# Patient Record
Sex: Male | Born: 1950
Health system: Southern US, Community
[De-identification: ages and names within clinical notes are randomized; demographics above are authoritative.]

## PROBLEM LIST (undated history)

## (undated) DIAGNOSIS — K5790 Diverticulosis of intestine, part unspecified, without perforation or abscess without bleeding: Secondary | ICD-10-CM

## (undated) DIAGNOSIS — E785 Hyperlipidemia, unspecified: Secondary | ICD-10-CM

## (undated) DIAGNOSIS — I10 Essential (primary) hypertension: Secondary | ICD-10-CM

## (undated) DIAGNOSIS — R011 Cardiac murmur, unspecified: Secondary | ICD-10-CM

## (undated) DIAGNOSIS — K219 Gastro-esophageal reflux disease without esophagitis: Secondary | ICD-10-CM

## (undated) HISTORY — DX: Gastro-esophageal reflux disease without esophagitis: K21.9

## (undated) HISTORY — PX: COLONOSCOPY: SHX174

## (undated) HISTORY — PX: POLYPECTOMY: SHX149

## (undated) HISTORY — PX: FRACTURE SURGERY: SHX138

## (undated) HISTORY — PX: FINGER AMPUTATION: SHX636

## (undated) HISTORY — DX: Hyperlipidemia, unspecified: E78.5

## (undated) HISTORY — DX: Diverticulosis of intestine, part unspecified, without perforation or abscess without bleeding: K57.90

## (undated) HISTORY — DX: Essential (primary) hypertension: I10

## (undated) HISTORY — DX: Cardiac murmur, unspecified: R01.1

---

## 2002-04-01 ENCOUNTER — Encounter: Payer: Self-pay | Admitting: Emergency Medicine

## 2002-04-01 ENCOUNTER — Emergency Department (HOSPITAL_COMMUNITY): Admission: EM | Admit: 2002-04-01 | Discharge: 2002-04-01 | Payer: Self-pay | Admitting: Emergency Medicine

## 2005-04-16 ENCOUNTER — Inpatient Hospital Stay (HOSPITAL_COMMUNITY): Admission: EM | Admit: 2005-04-16 | Discharge: 2005-04-22 | Payer: Self-pay | Admitting: Emergency Medicine

## 2005-04-24 ENCOUNTER — Ambulatory Visit: Payer: Self-pay | Admitting: Family Medicine

## 2005-07-30 ENCOUNTER — Ambulatory Visit: Payer: Self-pay | Admitting: Family Medicine

## 2005-12-29 ENCOUNTER — Ambulatory Visit: Payer: Self-pay | Admitting: Family Medicine

## 2006-01-12 ENCOUNTER — Ambulatory Visit: Payer: Self-pay | Admitting: Family Medicine

## 2006-10-16 ENCOUNTER — Ambulatory Visit: Payer: Self-pay | Admitting: Family Medicine

## 2006-10-19 ENCOUNTER — Ambulatory Visit: Payer: Self-pay | Admitting: Family Medicine

## 2006-10-22 ENCOUNTER — Ambulatory Visit: Payer: Self-pay | Admitting: Family Medicine

## 2006-10-29 ENCOUNTER — Ambulatory Visit: Payer: Self-pay | Admitting: Family Medicine

## 2006-11-16 ENCOUNTER — Ambulatory Visit: Payer: Self-pay | Admitting: Cardiology

## 2006-11-23 ENCOUNTER — Ambulatory Visit: Payer: Self-pay

## 2006-11-30 ENCOUNTER — Ambulatory Visit: Payer: Self-pay | Admitting: Family Medicine

## 2007-02-26 ENCOUNTER — Ambulatory Visit: Payer: Self-pay | Admitting: Family Medicine

## 2007-09-14 ENCOUNTER — Ambulatory Visit: Payer: Self-pay | Admitting: Family Medicine

## 2007-12-23 ENCOUNTER — Ambulatory Visit: Payer: Self-pay | Admitting: Family Medicine

## 2008-05-24 ENCOUNTER — Ambulatory Visit: Payer: Self-pay | Admitting: Family Medicine

## 2009-01-16 ENCOUNTER — Ambulatory Visit: Payer: Self-pay | Admitting: Family Medicine

## 2009-05-24 ENCOUNTER — Ambulatory Visit: Payer: Self-pay | Admitting: Family Medicine

## 2010-06-05 ENCOUNTER — Ambulatory Visit (INDEPENDENT_AMBULATORY_CARE_PROVIDER_SITE_OTHER): Payer: BLUE CROSS/BLUE SHIELD | Admitting: Family Medicine

## 2010-06-05 DIAGNOSIS — E119 Type 2 diabetes mellitus without complications: Secondary | ICD-10-CM

## 2010-06-05 DIAGNOSIS — I10 Essential (primary) hypertension: Secondary | ICD-10-CM

## 2010-06-05 DIAGNOSIS — Z79899 Other long term (current) drug therapy: Secondary | ICD-10-CM

## 2010-06-05 DIAGNOSIS — E782 Mixed hyperlipidemia: Secondary | ICD-10-CM

## 2010-07-09 NOTE — Assessment & Plan Note (Signed)
[name]                            CARDIOLOGY OFFICE NOTE   [name], [name]                   MRN:          [id number]  DATE:11/16/2006                            DOB:          May 12, 1950    PRIMARY:  Dr. Sharlot Gowda.   REASON FOR CONSULTATION:  Evaluate patient with heart murmur and  abnormal EKG.   HISTORY OF PRESENT ILLNESS:  Patient is a 60 year old African-American  gentleman who presents for evaluation of heart murmur.  He has been in  good health except for hospitalization last year.  Apparently he  had  flu-like symptoms.  He presented to the hospital where he was noted to  have severe hyperglycemia.  His wife reports that he was hospitalized  for a week.  This was a new diagnosis with diabetes.  He has also  recently been diagnosed with hyperlipidemia.  He saw Dr. Susann Givens and was  noted to have some increased voltages on his EKG as well as murmur.  The  patient has not been told of a heart murmur in the past.  He has  otherwise been well.  He denies any cardiovascular symptoms.  He works  as a Arboriculturist.  He climbs stairs multiple times a day.  He denies any  shortness of breath, PND or orthopnea.  He denies any chest discomfort,  neck or arm discomfort.  He has had no palpitations, presyncope or  syncope.  He had no unusual childhood illnesses.   PAST MEDICAL HISTORY:  1. Diabetes mellitus x1 year.  2. Hyperlipidemia.   PAST SURGICAL HISTORY:  1. Left pinky resected after an infection.  2. Left knee surgery after a fracture.   ALLERGIES:  None.   MEDICATIONS:  1. Acto/Met 15/850 b.i.d.  2. Enalapril 5 mg daily.  3. Lipitor 20 mg daily.  4. Metformin 1000 mg b.i.d.  5. Simvastatin 40 mg daily.  6. Glipizide 5 mg b.i.d.  7. Insulin p.r.n.   SOCIAL HISTORY:  1. The patient is married.  2. He has 1 child and 1 grandchild.  3. He quit smoking at age 62.  He smoked briefly before that.  4. He does not drink  alcohol.   FAMILY HISTORY:  1. Noncontributory for early coronary artery disease.  2. Both of his parents died at a young age of cancer.   REVIEW OF SYSTEMS:  As stated in the HPI and positive for reflux.  Negative for other systems.   PHYSICAL EXAMINATION:  The patient is in no distress.  Blood pressure  116/74, heart rate 76 and regular, weight 186 pounds.  HEENT:  Eyes unremarkable, pupils equal, round, react to light, fundi  not visualized, oral mucosa unremarkable.  NECK:  No jugular venous distention at 45 degrees, carotid upstroke  brisk and symmetric, transmitted systolic murmurs, no bruits, no  thyromegaly.  LYMPHATICS:  No cervical, axillary or inguinal adenopathy.  LUNGS:  Clear to auscultation bilaterally.  BACK:  No costovertebral angle tenderness.  CHEST:  Unremarkable.  HEART:  Not displaced and discrete, S1 within normal limits, S2 not  split, probable soft S2, no S3,  no S4, a 3/6 apical systolic murmur  radiating up the aortic outflow tract and heard at the right upper  sternal border and into the neck, no diastolic murmurs.  ABDOMEN:  Flat, positive bowel sounds, normal in frequency and pitch, no  bruits, no rebound, no guarding, no midline pulses, no masses, no  hepatomegaly, no splenomegaly.  SKIN:  No rashes, no nodules.  EXTREMITIES:  Pulses 2+ throughout, no edema, no cyanosis, no clubbing.  NEURO:  Oriented to person, place and time, cranial nerves II through  XII grossly intact, motor grossly intact.   EKG:  Sinus rhythm, rate 76, early transition lead V2, no acute ST-T  wave changes, intervals within normal limits.   ASSESSMENT AND PLAN:  1. Heart murmur.  The patient has a heart murmur consistent with      aortic stenosis.  I do not hear an S4 and he does not have      particularly elevated voltages.  I doubt that this is      hemodynamically significant.  He is not having any symptoms.  I      will check an echocardiogram.  Further evaluation will  be based on      these results.  2. Risk reduction.  The patient will continue to have risk reduction      per Dr. Susann Givens.  I will consider a screening exercise treadmill      test, given his diabetes, after I see his echocardiogram.  3. Followup.  I will see him back in about 1 month to review.     Rollene Rotunda, MD, [facility]  Electronically Signed   JH/MedQ  DD: 11/16/2006  DT: 11/16/2006  Job #: [id number]   cc:   Sharlot Gowda, M.D.

## 2010-07-12 NOTE — Discharge Summary (Signed)
NAMESEVERO, [name]            ACCOUNT NO.:  [id number]   MEDICAL RECORD NO.:  [id number]          PATIENT TYPE:  INP   LOCATION:  1617                         FACILITY:  [facility]   PHYSICIAN:  Elliot Cousin, M.D.    DATE OF BIRTH:  1950/07/31   DATE OF ADMISSION:  04/16/2005  DATE OF DISCHARGE:  04/22/2005                                 DISCHARGE SUMMARY   ADDENDUM:  THIS IS AN ADDENDUM DISCHARGE SUMMARY.  PLEASE SEE THE PREVIOUS  DISCHARGE SUMMARY DICTATED BY DR. Arletha Grippe ON April 21, 2005.   FINAL DISCHARGE DIAGNOSES:  1.  Newly diagnosed diabetes mellitus with presentation of diabetic      ketoacidosis.  2.  Hypertension.  3.  Hematuria secondary to the Foley catheter and/or urinary tract      infection.  4.  Pneumonia.  5.  Normocytic anemia.   DISCHARGE MEDICATIONS:  1.  Metformin 500 mg 1 tablet b.i.d.  2.  Glipizide 5 mg b.i.d.  3.  Enalapril 5 mg daily.  4.  Levaquin 500 mg daily for an additional seven days.  5.  Multivitamin with iron once daily.   DISPOSITION:  The patient was discharged to home in improved and stable  condition.  He has an appointment with physician, Dr. Audria Ramirez at the  [facility] clinic on Thursday, April 24, 2005.   HOSPITAL COURSE:  Problem 1:  NEWLY DIAGNOSED DIABETES MELLITUS WITH  PRESENTATION OF DKA:  Please see the previous discharge summary dictated by  Dr. Arletha Grippe.  Following Dr. Rickard Rhymes assessment, the patient remained stable  with regards to diabetes management.  He received diabetes education from  the registered dietician, the registered nurse, and the dictating physician.  The patient demonstrated fairly good understanding of diabetes and the  importance of compliance with medication and diet recommendations.  Prior to  this hospitalization, the patient really did not have a primary care  physician nor did he appreciate regular medical checkups.  For this reason,  the patient was discharged to home on an oral regimen only  rather than an  oral regimen and insulin.  Once the patient becomes acquainted with his new  primary care physician, Dr. Audria Ramirez, perhaps insulin therapy can be  restarted, if needed.  Both the Glucophage and glipizide are new medications  for the patient; therefore, with time, these medications may be all he  needs, at least in the short term.  Certainly, the patient's primary care  physician can determine whether or not the patient will need home insulin  therapy.  Both the patient and the patient's wife were informed of the  [facility] services and the appointment that was made.  The patient was  also given prescriptions for a Glucometer, lancets, and Glucometer strips.  He was advised to fill his prescriptions at [facility], which  provides a significantly reduced price per prescription.  The patient was  strongly advised to check his capillary blood sugars twice daily.  He was  also advised not to skip meals on the current medication therapy.  The  patient's hemoglobin A1C was 16.1 during the hospital course.  Problem 2:  HYPERTENSION:  The patient's blood pressures became a bit more  controlled towards the end of the hospital course.  He was maintained on  enalapril 5 mg daily.   Problem 3:  MILD HEMATURIA/PNEUMONIA/URINARY TRACT INFECTION:  Several days  prior to hospital discharge, the patient developed a low-grade fever.  He  complained of painful urination; however, at the time, he had an indwelling  Foley catheter.  When the Foley catheter was removed, he noticed some blood  at the urethral site.  Urinalysis was ordered and revealed large blood,  negative nitrites, and negative leukocytes.  The patient was started  empirically on ciprofloxacin.  For further workup of the fever, a chest x-  ray was ordered.  The chest x-ray revealed probable posterior lower lung  pneumonia.  The antibiotic was therefore changed to Levaquin 500 mg daily to  cover both the urinary  tract infection and pneumonia.  The hematuria may  have been also secondary to Foley trauma; however, he covered with Levaquin  for the urinary tract infection potential.  The patient had no complaints of  a productive cough during the hospital course; however, he did have a day or  two of intermittent nonproductive cough.  The patient was advised to take  over-the-counter Robitussin as needed for the cough.  Following the  initiation of antibiotic therapy, the patient's fever resolved.  At the time  of hospital discharge, his white blood cell count was within normal limits  at 7.2.   Problem 4:  NORMOCYTIC ANEMIA:  The patient's hemoglobin on admission was  13.7; however, following volume repletion, his hemoglobin slowly fell to  10.7 prior to hospital discharge.  There was no evidence of GI bleeding  during the hospital course, although his stools were not assessed for  microscopic blood.  The decrease in hemoglobin,  in part,  was secondary to  hemodilution from the IV fluids.  Iron studies were ordered and revealed a  vitamin B12 of 1188, ferritin of 427, total iron of 63, and a TIBC of 177.  These indices are more consistent with anemia of chronic disease; however,  given his age, he would benefit from further evaluation with a colonoscopy.  This decision will be deferred to the patient's new primary care physician,  Dr. Audria Ramirez.  Both the patient and the patient's wife were encouraged to  follow up on the recommendation of colon cancer screening.  The patient was  discharged to home on a multivitamin with iron once daily.      Elliot Cousin, M.D.  Electronically Signed     DF/MEDQ  D:  04/25/2005  T:  04/25/2005  Job:  [id number]   cc:   Maurice March, M.D.  Fax: [phone]

## 2010-07-12 NOTE — H&P (Signed)
NAMEKIING, James            ACCOUNT NO.:  [id number]   MEDICAL RECORD NO.:  [id number]          PATIENT TYPE:  INP   LOCATION:  0102                         FACILITY:  [facility]   PHYSICIAN:  Lonia Blood, M.D.DATE OF BIRTH:  09-19-1950   DATE OF ADMISSION:  04/16/2005  DATE OF DISCHARGE:                                HISTORY & PHYSICAL   PRIMARY CARE PHYSICIAN:  Sharlot Gowda, M.D.   CHIEF COMPLAINT:  Vomiting and weakness.   HISTORY OF PRESENT ILLNESS:  [name] is a 60 year old gentleman  with no apparent previous medical history to speak of.  He was in his usual  state of health, but noticed increased urination dating back to probably 2  weeks ago.  The patient, himself, is significant obtunded at present and  unable to provide any history.  All history is provided by his wife, who  accompanies him to the ER.  The patient's polyuria continued for at least 2  weeks.  This was associated with intermittent lethargy.  Approximately 4  days ago, the patient began to develop significant nausea.  The following  day, this developed into diarrhea.  The patient vomited multiple times  during the day.  He was subsequently sent home from work.  This was a Monday  (it is currently Wednesday).  Since being sent home from work, the patient  has continued to vomit.  He has had minimal p.o. intake.  He has, however,  continued to urinate on a very frequent basis.  He has become more and more  lethargic.  The patient was, in fact, secondary to see Dr. Susann Givens today at  noon, but was too weak to even get up and go.  As the patient's weakness  continued through the afternoon, he also began to be very confused.  With  this new development, the patient's wife became concerned about him, and  transported him to the emergency room.  In the emergency room, a serum  glucose of 1226 with a serum bicarbonate of 6 has been appreciated, and I  have been called to admit the patient for  DKA.   REVIEW OF SYSTEMS:  The patient is unable to provide any history, as he is  significantly obtunded at the present time.   PAST MEDICAL HISTORY:  No prior medical history, admissions or surgeries,  per the patient's wife.   MEDICATIONS:  No chronic medications.   ALLERGIES:  No known drug allergies.   FAMILY HISTORY:  Unable to obtain secondary to obtundation.   SOCIAL HISTORY:  The patient lives in [place].  He is married.  He does  not smoke.  He does not drink.  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   DATA REVIEWED:  Sodium is low at 123, potassium is high at 5.8, chloride of  76, serum bicarbonate of 6, BUN 79, creatinine 4.1, serum glucose of 1226.  White count is elevated at 18.8.  Hemoglobin is normal at 15.8 with an MCV  of 85 and platelet count of 303.  No other labs or x-rays or EKGs are  available at this time.   PHYSICAL EXAMINATION:  VITAL SIGNS:  Temperature 97.7, blood pressure  101/69, heart rate 120, respiratory rate 20, O2 saturation of 99% on room  air.  GENERAL:  Well-developed, well-nourished male in no acute respiratory  distress, although he is tachypneic.  He does not appear to be in severe  respiratory distress, but he is tachypneic.  HEENT:  Normocephalic and atraumatic.  Pupils equal, round and reactive to  light and accommodation, though sluggish.  OC/OP remarkable for dry mucous  membranes.  NECK:  No JVD, no lymphadenopathy, no thyromegaly, with bounding carotid  pulse.  LUNGS:  Clear to auscultation bilaterally without wheezes or rhonchi.  CARDIOVASCULAR:  Tachycardic at 120 beats per minute without murmur, gallop,  or rub appreciable.  ABDOMEN:  Thin, nontender, nondistended.  Soft.  Bowel sounds present.  No  hepatosplenomegaly.  No rebound.  No ascites.  EXTREMITIES:  No significant clubbing, cyanosis, or edema bilateral lower  extremities.  NEUROLOGIC:  The patient is able to move all 4 extremities on  command.  Cranial nerves II-XII were intact.  He is not alert, but does intermittently  open eyes and attempt to answer questions, though speech is simply garbled.  There is no Babinski.  He is intact to sensation and touch throughout.   IMPRESSION AND PLAN:  1.  Severe diabetic hyperosmolar state versus diabetic ketoacidosis.  The      patient does not carry a previous diagnosis of diabetes.  This will be a      new diagnosis for him.  Certainly, now, he is suffering with a severe      ketotic or non-ketotic state.  His serum ketones are still pending.      Regardless, the patient will require an insulin drip and very high      volume of IV fluid administration.  We will admit him to the ICU.  Will      place him on a Glucommander, but we will follow his blood sugar very      closely.  I do not wish to correct him at a rate any greater than 200 to      250 mg/dl per hour.  We will need to follow him closely on the      Glucommander to assure this does not happen.  We, of course, will follow      his metabolic acidosis very closely and hope that his gap will close      rapidly with IV infusion of insulin.  2.  Severe metabolic acidosis.  The patient's acidosis is felt to be      secondary to ketoacidosis in this patient who appears to be suffering      with severe DKA.  Treatment will be to address the primary issue.      Bicarbonate was not indicated at this time, as the patient's acidosis      should correct with the appropriate administration of high volume fluid      and IV insulin.  We will follow his anion gap closely, and, if his      bicarbonate does not increase with IV insulin administration, will      consider adjusting our fluid regimen.  3.  Severe dehydration.  This is, of course, secondary to diabetic      ketoacidosis and to high volume losses due to glucosuria over the last 2     weeks.  We will hydrate the patient aggressively, as he  is a 60 year old      gentleman who  is otherwise healthy.  I will place two IVs - one we will      use to administer normal saline continuously at 200 cc per hour.  The      patient has received a 1 liter bolus already in the emergency room.  The      second IV will be use to administer 100 cc of normal saline initially,      and the for appropriate titration as required for the Glucommander.  The      first IV will not be changed, despite recommendations from the      Glucommander, and this is where the second IV comes in, as it will allow      [place] to administer varying doses of glucose without affecting the      patient's backup normal saline rate.  4.  Acute renal failure.  It is my hope that this represents a pure prerenal      azotemia.  The patient is certainly severely dehydrated.  We will      withhold the renal ultrasound at this time.  The patient is not showing      evidence of obstruction, as he is urinating very frequently.  We with      place a Foley catheter, and follow I&O's closely.  If his renal function      does not improve with volume resuscitation, we will then consider a      renal ultrasound and possible renal consultation.  5.  Hyperkalemia.  This is felt to be simply secondary to intracellular      shifts.  As is usual with diabetic ketoacidosis, we expect that his      potassium will actually fall precipitously once we begin to correct his      diabetic ketoacidosis.  We will check BMETs on a q.1h. basis.  When      potassium falls below 4, we will consider replacement of his potassium.  6.  Pseudohyponatremia.  The patient presents with a sodium of 123.  Within      the setting of a serum glucose of 1226, this is very clearly a      pseudohyponatremia.  We will follow the patient's sodium on a 1-hour      basis as we administer insulin and correct his serum glucose.  7.  Altered mental status.  This is felt to be a sequelae of diabetic      ketoacidosis, severe dehydration and acute renal       failure, as well as severe metabolic acidosis.  We will attend to all of      these issues as detailed above, and expect the patient's mental status      will return to normal.  Neurologic exam does not suggest any localizing      lesions, and, therefore, a CT scan of the head is not necessary at this      time.      Lonia Blood, M.D.  Electronically Signed     JTM/MEDQ  D:  04/16/2005  T:  04/17/2005  Job:  [id number]   cc:   Sharlot Gowda, M.D.  Fax: [phone]

## 2010-07-12 NOTE — Discharge Summary (Signed)
NAMEJOSHA, [name] NO.:  [id number]   MEDICAL RECORD NO.:  [id number]          PATIENT TYPE:  INP   LOCATION:  1617                         FACILITY:  [facility]   PHYSICIAN:  Nelma Rothman, MD   DATE OF BIRTH:  [birth year]   DATE OF ADMISSION:  04/16/2005  DATE OF DISCHARGE:                                 DISCHARGE SUMMARY   INTERIM DISCHARGE SUMMARY:  Please note that this is an Interim Discharge  Summary, and final Discharge Summary will be dictated by physician at that  time.   CURRENT DIAGNOSES:  1.  Diabetic ketoacidosis.  2.  New diagnosis of diabetes.  3.  Hypertension.   PROCEDURES:  1.  Chest x-ray on admission demonstrated no active disease.  2.  CT of the abdomen and pelvis with contrast on February 23 demonstrated      no acute abnormality.  On CT of the abdomen, there was a probable      incidental 23 mm right adrenal adenoma as well as small pleural      effusions with mild bibasilar atelectasis.  There were rectosigmoid      colonic diverticula but no diverticulitis or abscess.  There was a very      small amount of fluid that layered posteriorly in the pelvis.   LABORATORY DATA:  On admission, ABG 7.148, pCO2 of 22.  White blood cell  count on admission was 18.8, currently 7.4.  Creatinine initially was 3.3  and is now 0.8.  Hemoglobin A1c is 16.1.  Cardiac enzymes were negative x3.  Liver function tests were within normal limits, and a lipase was 18.  Urine  drug screen was negative.  Urinalysis on admission was positive only for  ketones as well as a small amount of bilirubin and glucose.  Urine culture  was negative.   HISTORY AND PHYSICAL:  Please seen dictated History and Physical by Dr.  Jetty Duhamel on February 21.  But briefly, [name] presented to the  [facility] with vomiting, weakness, and confusion and  was found to be in diabetic ketoacidosis.  He had no known history of  diabetes.   HOSPITAL  COURSE:  Detailed by systems.   #1.  DIABETIC KETOACIDOSIS:  [name] was admitted to the ICU and placed  on IV insulin drip.  With this, his sugars decreased gradually as would be  expected.  He was vigorously resuscitated with IV fluids as well.  With  improvement in his blood sugar, his mental status improved as well.  He  continued to have some abdominal pain as well as vomiting even with  resolution of his ketoacidosis.  Given this and a persistently elevated  white blood cell count, a CT abdomen and pelvis was performed on February 23  to evaluate for any intra-abdominal pathology which was essentially negative  for any acute process.  With regard to the etiology of diabetic  ketoacidosis, I suspect that this is secondary to undiagnosed, untreated  diabetes.  No other etiology was ever elicited.  Cardiac enzymes were  negative x3.  A urine culture was  negative.  Chest x-ray was clear.   #2.  NEW DIAGNOSIS OF DIABETES:  I had hoped that we would be able to  control his blood sugars with oral hypoglycemics given that he is a newly  diagnosed diabetic; however, with the hemoglobin A1c of 16.1 and his  presentation in diabetic ketoacidosis, I suspect this will not be the case.  He is currently on metformin and glipizide in attempt to increase his levels  of such, but he is still requiring 18 units of Lantus at this time with  sugars with suboptimal control.  Over the next couple of days, we will hope  to stabilize his blood sugars on a regimen that is easiest for him at home.  Should he need to go home on insulin, which I suspect he will, could  probably discontinue the glipizide as it is not adding much at that point.   #3.  HYPERTENSION:  Have started low-dose Enalapril given his history of  diabetes.  Will need to have his electrolytes and creatinine monitored  closely as an outpatient, and this will likely need to be increased as  needed.   DISPOSITION:  At this  point, his  sugars are still suboptimal, and he needs  a fair amount of education and teaching prior to discharge as well as  teaching of insulin administration technique or I fear he will be right back  in the hospital again.  I expect he will be nearing time for discharge in  the next couple of days and will need very close followup with his doctor  over the next few weeks to months.   Please note that this is an Interim Discharge Summary, and a final Discharge  Summary will be dictated with medications at the time of discharge by the  physician then.      Nelma Rothman, MD  Electronically Signed     RAR/MEDQ  D:  04/20/2005  T:  04/20/2005  Job:  [id number]

## 2010-09-26 ENCOUNTER — Encounter: Payer: Self-pay | Admitting: Family Medicine

## 2010-10-08 ENCOUNTER — Ambulatory Visit: Payer: BLUE CROSS/BLUE SHIELD | Admitting: Family Medicine

## 2010-10-15 ENCOUNTER — Ambulatory Visit (INDEPENDENT_AMBULATORY_CARE_PROVIDER_SITE_OTHER): Payer: BLUE CROSS/BLUE SHIELD | Admitting: Family Medicine

## 2010-10-15 ENCOUNTER — Encounter: Payer: Self-pay | Admitting: Family Medicine

## 2010-10-15 DIAGNOSIS — E1169 Type 2 diabetes mellitus with other specified complication: Secondary | ICD-10-CM | POA: Insufficient documentation

## 2010-10-15 DIAGNOSIS — E785 Hyperlipidemia, unspecified: Secondary | ICD-10-CM

## 2010-10-15 DIAGNOSIS — E1159 Type 2 diabetes mellitus with other circulatory complications: Secondary | ICD-10-CM | POA: Insufficient documentation

## 2010-10-15 DIAGNOSIS — Z23 Encounter for immunization: Secondary | ICD-10-CM

## 2010-10-15 DIAGNOSIS — I10 Essential (primary) hypertension: Secondary | ICD-10-CM

## 2010-10-15 DIAGNOSIS — E119 Type 2 diabetes mellitus without complications: Secondary | ICD-10-CM | POA: Insufficient documentation

## 2010-10-15 NOTE — Patient Instructions (Signed)
Come back in about a month for flu shot otherwise he'll see you in 4 months

## 2010-10-15 NOTE — Progress Notes (Signed)
  Subjective:    Patient ID: [name], male    DOB: 12-24-1950, 60 y.o.   MRN: [id number]  HPI He is here for a diabetes recheck. He has made some dietary changes and has lost 7 pounds. Continues on medications listed in the chart. He checks his blood sugars and they usually run in the low 100s. His last eye exam was May 21. He checks his feet regularly. His work and home life are going well. He has no other concerns or complaints. Social history was reviewed and is in the record.   Review of Systems Negative except as above    Objective:   Physical Exam Alert and in no distress otherwise not examined. Hemoglobin A1c 6.9      Assessment & Plan:  Diabetes. Hypertension. Hyperlipidemia. He is to continue with his present medication regimen as well as lifestyle. Recommend returning here month for flu shot. I will also give pneumonia shot

## 2010-12-11 ENCOUNTER — Other Ambulatory Visit: Payer: Self-pay | Admitting: Family Medicine

## 2010-12-12 ENCOUNTER — Telehealth: Payer: Self-pay | Admitting: Family Medicine

## 2010-12-12 NOTE — Telephone Encounter (Signed)
DONE

## 2010-12-12 NOTE — Telephone Encounter (Signed)
Patient needs to make an appointment with doctor.

## 2011-02-20 ENCOUNTER — Ambulatory Visit (INDEPENDENT_AMBULATORY_CARE_PROVIDER_SITE_OTHER): Payer: BLUE CROSS/BLUE SHIELD | Admitting: Family Medicine

## 2011-02-20 ENCOUNTER — Encounter: Payer: Self-pay | Admitting: Family Medicine

## 2011-02-20 VITALS — BP 120/80 | HR 70 | Wt 170.0 lb

## 2011-02-20 DIAGNOSIS — E119 Type 2 diabetes mellitus without complications: Secondary | ICD-10-CM

## 2011-02-20 DIAGNOSIS — E1169 Type 2 diabetes mellitus with other specified complication: Secondary | ICD-10-CM

## 2011-02-20 DIAGNOSIS — E785 Hyperlipidemia, unspecified: Secondary | ICD-10-CM

## 2011-02-20 DIAGNOSIS — Z23 Encounter for immunization: Secondary | ICD-10-CM

## 2011-02-20 DIAGNOSIS — E1159 Type 2 diabetes mellitus with other circulatory complications: Secondary | ICD-10-CM

## 2011-02-20 DIAGNOSIS — I10 Essential (primary) hypertension: Secondary | ICD-10-CM

## 2011-02-20 LAB — POCT GLYCOSYLATED HEMOGLOBIN (HGB A1C): Hemoglobin A1C: 6.7

## 2011-02-20 NOTE — Progress Notes (Signed)
  Subjective:    Patient ID: [name], male    DOB: Apr 01, 1950, 60 y.o.   MRN: [id number]  HPI He is here for followup on his diabetes. He does check his blood sugars fairly regularly and they usually run in the low 100s. He does check his feet periodically and has had an eye exam in the last year. He does not smoke or drink. He does exercise fairly regularly and recently bought a bicycle.  Review of Systems     Objective:   Physical Exam Alert and in no distress.  A1c is 6.7      Assessment & Plan:    I encouraged him to remain active. Opinion on present medication regimen. Recheck here 4 months.

## 2011-03-23 ENCOUNTER — Other Ambulatory Visit: Payer: Self-pay | Admitting: Family Medicine

## 2011-03-24 ENCOUNTER — Telehealth: Payer: Self-pay | Admitting: Family Medicine

## 2011-03-31 NOTE — Telephone Encounter (Signed)
03/31/2011 °

## 2011-04-01 ENCOUNTER — Telehealth: Payer: Self-pay | Admitting: Internal Medicine

## 2011-04-01 NOTE — Telephone Encounter (Signed)
Find out exactly what ointment he wants.

## 2011-04-01 NOTE — Telephone Encounter (Signed)
Called [facility] and ordered for pt

## 2011-04-23 ENCOUNTER — Other Ambulatory Visit: Payer: Self-pay | Admitting: Family Medicine

## 2011-06-18 ENCOUNTER — Other Ambulatory Visit: Payer: Self-pay | Admitting: Family Medicine

## 2011-06-19 ENCOUNTER — Encounter: Payer: Self-pay | Admitting: Family Medicine

## 2011-06-19 ENCOUNTER — Ambulatory Visit (INDEPENDENT_AMBULATORY_CARE_PROVIDER_SITE_OTHER): Payer: BLUE CROSS/BLUE SHIELD | Admitting: Family Medicine

## 2011-06-19 DIAGNOSIS — I10 Essential (primary) hypertension: Secondary | ICD-10-CM

## 2011-06-19 DIAGNOSIS — E1159 Type 2 diabetes mellitus with other circulatory complications: Secondary | ICD-10-CM

## 2011-06-19 DIAGNOSIS — E1169 Type 2 diabetes mellitus with other specified complication: Secondary | ICD-10-CM

## 2011-06-19 DIAGNOSIS — Z79899 Other long term (current) drug therapy: Secondary | ICD-10-CM

## 2011-06-19 DIAGNOSIS — E119 Type 2 diabetes mellitus without complications: Secondary | ICD-10-CM

## 2011-06-19 DIAGNOSIS — E785 Hyperlipidemia, unspecified: Secondary | ICD-10-CM

## 2011-06-19 LAB — CBC WITH DIFFERENTIAL/PLATELET
Basophils Absolute: 0 10*3/uL (ref 0.0–0.1)
Eosinophils Relative: 5 % (ref 0–5)
HCT: 43 % (ref 39.0–52.0)
Hemoglobin: 14.2 g/dL (ref 13.0–17.0)
Lymphocytes Relative: 24 % (ref 12–46)
MCHC: 33 g/dL (ref 30.0–36.0)
MCV: 85.1 fL (ref 78.0–100.0)
Monocytes Absolute: 0.9 10*3/uL (ref 0.1–1.0)
Monocytes Relative: 8 % (ref 3–12)
Neutro Abs: 6.8 10*3/uL (ref 1.7–7.7)
RDW: 15.5 % (ref 11.5–15.5)

## 2011-06-19 LAB — COMPREHENSIVE METABOLIC PANEL
AST: 21 U/L (ref 0–37)
Albumin: 4.4 g/dL (ref 3.5–5.2)
BUN: 28 mg/dL — ABNORMAL HIGH (ref 6–23)
Calcium: 9.3 mg/dL (ref 8.4–10.5)
Chloride: 106 mEq/L (ref 96–112)
Creat: 1.04 mg/dL (ref 0.50–1.35)
Glucose, Bld: 149 mg/dL — ABNORMAL HIGH (ref 70–99)
Potassium: 4.1 mEq/L (ref 3.5–5.3)

## 2011-06-19 LAB — LIPID PANEL
Cholesterol: 159 mg/dL (ref 0–200)
HDL: 45 mg/dL (ref >39)
Total CHOL/HDL Ratio: 3.5 Ratio
Triglycerides: 76 mg/dL (ref <150)

## 2011-06-19 MED ORDER — ENALAPRIL MALEATE 5 MG PO TABS: 5.0000 mg | ORAL_TABLET | Freq: Every day | ORAL | Status: DC

## 2011-06-19 MED ORDER — ATORVASTATIN CALCIUM 40 MG PO TABS: 40.0000 mg | ORAL_TABLET | Freq: Every day | ORAL | Status: DC

## 2011-06-19 MED ORDER — SITAGLIPTIN PHOSPHATE 100 MG PO TABS: 100.0000 mg | ORAL_TABLET | Freq: Every day | ORAL | Status: DC

## 2011-06-19 NOTE — Progress Notes (Signed)
  Subjective:    Patient ID: [name], male    DOB: Feb 04, 1951, 61 y.o.   MRN: [id number]  HPI He is here for a diabetes recheck. He does check his blood sugars fairly regularly. He will sometimes check it after meals but not necessarily at the 2 hour time interval. He continues on medications listed in the chart. Review of the record indicates that he was on Actos plus but there is no good documentation as to why this was discontinued. He probably never refilled this. He keeps himself quite busy. He does not smoke or drink. He did have a foot exam 6 months ago. He will have his eyes rechecked in May.   Review of Systems     Objective:   Physical Exam Alert and in no distress otherwise not examined. Hemoglobin A1c 7.1.       Assessment & Plan:   1. Diabetes mellitus  POCT HgB A1C, sitaGLIPtin (JANUVIA) 100 MG tablet, CBC with Differential, Comprehensive metabolic panel, Lipid panel, POCT UA - Microalbumin  2. Hypertension associated with diabetes  enalapril (VASOTEC) 5 MG tablet  3. Hyperlipidemia LDL goal <70  atorvastatin (LIPITOR) 40 MG tablet  4. Encounter for long-term (current) use of other medications  CBC with Differential, Comprehensive metabolic panel, Lipid panel, POCT UA - Microalbumin   encouraged him to check his blood sugars before a meal or 2 hours after meal. I will also schedule him for a complete examination. Discussed the fact that we might need to add metformin back to his regimen.

## 2011-06-19 NOTE — Patient Instructions (Signed)
Continue on your present medications. I will do a complete exam with her next visit.

## 2011-10-01 ENCOUNTER — Encounter: Payer: Self-pay | Admitting: Family Medicine

## 2011-10-01 ENCOUNTER — Ambulatory Visit (INDEPENDENT_AMBULATORY_CARE_PROVIDER_SITE_OTHER): Payer: BC Managed Care – PPO | Admitting: Family Medicine

## 2011-10-01 VITALS — BP 118/70 | HR 69 | Wt 157.0 lb

## 2011-10-01 DIAGNOSIS — E785 Hyperlipidemia, unspecified: Secondary | ICD-10-CM

## 2011-10-01 DIAGNOSIS — E1159 Type 2 diabetes mellitus with other circulatory complications: Secondary | ICD-10-CM

## 2011-10-01 DIAGNOSIS — E1169 Type 2 diabetes mellitus with other specified complication: Secondary | ICD-10-CM

## 2011-10-01 DIAGNOSIS — N529 Male erectile dysfunction, unspecified: Secondary | ICD-10-CM

## 2011-10-01 DIAGNOSIS — E119 Type 2 diabetes mellitus without complications: Secondary | ICD-10-CM

## 2011-10-01 DIAGNOSIS — I10 Essential (primary) hypertension: Secondary | ICD-10-CM

## 2011-10-01 LAB — POCT UA - MICROALBUMIN: Creatinine, POC: 135.8 mg/dL

## 2011-10-01 MED ORDER — SILDENAFIL CITRATE 50 MG PO TABS: 50.0000 mg | ORAL_TABLET | Freq: Every day | ORAL | Status: DC | PRN

## 2011-10-01 NOTE — Progress Notes (Signed)
  Subjective:    Patient ID: [name], male    DOB: 1950-11-12, 61 y.o.   MRN: [id number]  HPI He is here for a diabetes recheck. He continues on medications listed in the chart. His job keeps him quite active. He does not smoke or drink. He does check his blood sugars periodically. He has had no difficulty taking his statin drug or blood pressure medications other than now he does complain of difficulty getting and maintaining erections. He does have the desire but is noted an inability to achieve full erection.  Review of Systems     Objective:   Physical Exam Alert and in no distress. Hemoglobin A1c is 6.6.       Assessment & Plan:   1. Diabetes mellitus  POCT glycosylated hemoglobin (Hb A1C), POCT UA - Microalbumin  2. Hyperlipidemia LDL goal <70    3. Hypertension associated with diabetes    4. ED (erectile dysfunction)  sildenafil (VIAGRA) 50 MG tablet   erectile dysfunction was discussed with him. Gave him instructions on proper use of the medication and potential side effects. Instructed him to call me if the Viagra is not successful.

## 2012-02-02 ENCOUNTER — Ambulatory Visit: Payer: BC Managed Care – PPO | Admitting: Family Medicine

## 2012-02-09 ENCOUNTER — Encounter: Payer: Self-pay | Admitting: Family Medicine

## 2012-02-09 ENCOUNTER — Ambulatory Visit (INDEPENDENT_AMBULATORY_CARE_PROVIDER_SITE_OTHER): Payer: BC Managed Care – PPO | Admitting: Family Medicine

## 2012-02-09 VITALS — BP 160/100 | HR 80 | Wt 165.0 lb

## 2012-02-09 DIAGNOSIS — E785 Hyperlipidemia, unspecified: Secondary | ICD-10-CM

## 2012-02-09 DIAGNOSIS — Z2911 Encounter for prophylactic immunotherapy for respiratory syncytial virus (RSV): Secondary | ICD-10-CM

## 2012-02-09 DIAGNOSIS — N529 Male erectile dysfunction, unspecified: Secondary | ICD-10-CM | POA: Insufficient documentation

## 2012-02-09 DIAGNOSIS — E119 Type 2 diabetes mellitus without complications: Secondary | ICD-10-CM

## 2012-02-09 DIAGNOSIS — E1169 Type 2 diabetes mellitus with other specified complication: Secondary | ICD-10-CM

## 2012-02-09 DIAGNOSIS — E1159 Type 2 diabetes mellitus with other circulatory complications: Secondary | ICD-10-CM

## 2012-02-09 DIAGNOSIS — I10 Essential (primary) hypertension: Secondary | ICD-10-CM

## 2012-02-09 LAB — POCT GLYCOSYLATED HEMOGLOBIN (HGB A1C): Hemoglobin A1C: 6.6

## 2012-02-09 MED ORDER — SILDENAFIL CITRATE 100 MG PO TABS: 100.0000 mg | ORAL_TABLET | Freq: Every day | ORAL | Status: DC | PRN

## 2012-02-09 NOTE — Progress Notes (Signed)
  Subjective:    Patient ID: [name], male    DOB: [birth year], 61 y.o.   MRN: [id number]  HPI He is here for a recheck. He does intermittently check his blood sugars. He gets regular eye exams and checks his feet regularly. Smoking and drinking were reviewed. He continues on medications listed in the chart. He would like a refill on his Viagra. He has no other concerns or complaints.   Review of Systems     Objective:   Physical Exam Alert and in no distress otherwise not examined. Blood work was reviewed. Hemoglobin A1c is 6.6.       Assessment & Plan:   1. Diabetes mellitus  POCT glycosylated hemoglobin (Hb A1C)  2. ED (erectile dysfunction)    3. Hyperlipidemia LDL goal <70    4. Hypertension associated with diabetes     continue to take good care of himself.

## 2012-06-08 ENCOUNTER — Encounter: Payer: Self-pay | Admitting: Family Medicine

## 2012-06-08 ENCOUNTER — Ambulatory Visit (INDEPENDENT_AMBULATORY_CARE_PROVIDER_SITE_OTHER): Payer: BC Managed Care – PPO | Admitting: Family Medicine

## 2012-06-08 VITALS — BP 124/80 | HR 86 | Wt 164.0 lb

## 2012-06-08 DIAGNOSIS — I10 Essential (primary) hypertension: Secondary | ICD-10-CM

## 2012-06-08 DIAGNOSIS — E785 Hyperlipidemia, unspecified: Secondary | ICD-10-CM

## 2012-06-08 DIAGNOSIS — E1169 Type 2 diabetes mellitus with other specified complication: Secondary | ICD-10-CM

## 2012-06-08 DIAGNOSIS — E119 Type 2 diabetes mellitus without complications: Secondary | ICD-10-CM

## 2012-06-08 DIAGNOSIS — N529 Male erectile dysfunction, unspecified: Secondary | ICD-10-CM

## 2012-06-08 LAB — POCT GLYCOSYLATED HEMOGLOBIN (HGB A1C): Hemoglobin A1C: 7

## 2012-06-08 MED ORDER — SITAGLIPTIN PHOSPHATE 100 MG PO TABS: 100.0000 mg | ORAL_TABLET | Freq: Every day | ORAL | Status: DC

## 2012-06-08 MED ORDER — ATORVASTATIN CALCIUM 40 MG PO TABS: 40.0000 mg | ORAL_TABLET | Freq: Every day | ORAL | Status: DC

## 2012-06-08 MED ORDER — ENALAPRIL MALEATE 5 MG PO TABS: 5.0000 mg | ORAL_TABLET | Freq: Every day | ORAL | Status: DC

## 2012-06-08 NOTE — Progress Notes (Signed)
Last eye exam:06/2011 [facility] Last foot exam : 02/09/12 [place] Test 1 time a day B/S 85-126

## 2012-06-08 NOTE — Patient Instructions (Signed)
Keep up the good work

## 2012-06-08 NOTE — Progress Notes (Signed)
  Subjective:    [name] is a 62 y.o. male who presents for follow-up of Type 2 diabetes mellitus.    Home blood sugar records: fasting range: 80 to 120  Current symptoms/problems include none and have   been unchanged. Daily foot checks, foot concerns:  Last eye exam:     Medication compliance: Current diet: in general, a "healthy" diet   Current exercise: bicycling and walking Known diabetic complications: none Cardiovascular risk factors: advanced age (older than 56 for men, 61 for women), diabetes mellitus, dyslipidemia, hypertension and male gender   The following portions of the patient's history were reviewed and updated as appropriate: allergies, current medications, past family history, past medical history, past social history, past surgical history and problem list.  ROS as in subjective above    Objective:    BP 124/80  Pulse 86  Wt 164 lb (74.39 kg)  BMI 24.21 kg/m2  Filed Vitals:   06/08/12 0948  BP: 124/80  Pulse: 86    General appearence: alert, no distress, WD/WN Neck: supple, no lymphadenopathy, no thyromegaly, no masses Heart: RRR, normal S1, S2, no murmurs Lungs: CTA bilaterally, no wheezes, rhonchi, or rales Abdomen: +bs, soft, non tender, non distended, no masses, no hepatomegaly, no splenomegaly Pulses: 2+ symmetric, upper and lower extremities, normal cap refill Ext: no edema Foot exam:  Neuro: foot monofilament exam normal   Lab Review Lab Results  Component Value Date   HGBA1C 7.0 06/08/2012   Lab Results  Component Value Date   CHOL 159 06/19/2011   HDL 45 06/19/2011   LDLCALC 99 06/19/2011   TRIG 76 06/19/2011   CHOLHDL 3.5 06/19/2011   No results found for this basenameConcepcion Elk     Chemistry      Component Value Date/Time   NA 139 06/19/2011 0859   K 4.1 06/19/2011 0859   CL 106 06/19/2011 0859   CO2 26 06/19/2011 0859   BUN 28* 06/19/2011 0859   CREATININE 1.04 06/19/2011 0859      Component Value  Date/Time   CALCIUM 9.3 06/19/2011 0859   ALKPHOS 52 06/19/2011 0859   AST 21 06/19/2011 0859   ALT 18 06/19/2011 0859   BILITOT 0.3 06/19/2011 0859        Chemistry      Component Value Date/Time   NA 139 06/19/2011 0859   K 4.1 06/19/2011 0859   CL 106 06/19/2011 0859   CO2 26 06/19/2011 0859   BUN 28* 06/19/2011 0859   CREATININE 1.04 06/19/2011 0859      Component Value Date/Time   CALCIUM 9.3 06/19/2011 0859   ALKPHOS 52 06/19/2011 0859   AST 21 06/19/2011 0859   ALT 18 06/19/2011 0859   BILITOT 0.3 06/19/2011 0859     HbA1C  7.0  Last optometry/ophthalmology exam reviewed from:    Assessment:   Encounter Diagnoses  Name Primary?  . Diabetes mellitus Yes  . Hypertension associated with diabetes   . Hyperlipidemia LDL goal <70   . ED (erectile dysfunction)          Plan:    1.  Rx changes: none 2.  Education: Reviewed 'ABCs' of diabetes management (respective goals in parentheses):  A1C (<7), blood pressure (<130/80), and cholesterol (LDL <100). 3.  Compliance at present is estimated to be good. Efforts to improve compliance (if necessary) will be directed at No change in present therapy. 4. Follow up: 4 months

## 2012-10-14 ENCOUNTER — Encounter: Payer: Self-pay | Admitting: Family Medicine

## 2012-10-14 ENCOUNTER — Ambulatory Visit (INDEPENDENT_AMBULATORY_CARE_PROVIDER_SITE_OTHER): Payer: BC Managed Care – PPO | Admitting: Family Medicine

## 2012-10-14 VITALS — BP 114/70 | HR 73 | Wt 155.0 lb

## 2012-10-14 DIAGNOSIS — Z79899 Other long term (current) drug therapy: Secondary | ICD-10-CM

## 2012-10-14 DIAGNOSIS — E1159 Type 2 diabetes mellitus with other circulatory complications: Secondary | ICD-10-CM

## 2012-10-14 DIAGNOSIS — E785 Hyperlipidemia, unspecified: Secondary | ICD-10-CM

## 2012-10-14 DIAGNOSIS — N529 Male erectile dysfunction, unspecified: Secondary | ICD-10-CM

## 2012-10-14 DIAGNOSIS — I10 Essential (primary) hypertension: Secondary | ICD-10-CM

## 2012-10-14 DIAGNOSIS — E119 Type 2 diabetes mellitus without complications: Secondary | ICD-10-CM

## 2012-10-14 DIAGNOSIS — E1169 Type 2 diabetes mellitus with other specified complication: Secondary | ICD-10-CM

## 2012-10-14 LAB — POCT GLYCOSYLATED HEMOGLOBIN (HGB A1C): Hemoglobin A1C: 6.5

## 2012-10-14 LAB — CBC WITH DIFFERENTIAL/PLATELET
Basophils Absolute: 0 10*3/uL (ref 0.0–0.1)
Basophils Relative: 0 % (ref 0–1)
Eosinophils Absolute: 0.5 10*3/uL (ref 0.0–0.7)
Hemoglobin: 14.3 g/dL (ref 13.0–17.0)
MCH: 28.5 pg (ref 26.0–34.0)
MCHC: 34 g/dL (ref 30.0–36.0)
Monocytes Relative: 9 % (ref 3–12)
Neutrophils Relative %: 59 % (ref 43–77)
Platelets: 289 10*3/uL (ref 150–400)
RDW: 15.3 % (ref 11.5–15.5)

## 2012-10-14 LAB — LIPID PANEL
HDL: 36 mg/dL — ABNORMAL LOW (ref >39)
LDL Cholesterol: 64 mg/dL (ref 0–99)
Triglycerides: 130 mg/dL (ref <150)
VLDL: 26 mg/dL (ref 0–40)

## 2012-10-14 LAB — COMPREHENSIVE METABOLIC PANEL
Alkaline Phosphatase: 54 U/L (ref 39–117)
Glucose, Bld: 125 mg/dL — ABNORMAL HIGH (ref 70–99)
Sodium: 137 mEq/L (ref 135–145)
Total Bilirubin: 0.5 mg/dL (ref 0.3–1.2)
Total Protein: 6.8 g/dL (ref 6.0–8.3)

## 2012-10-14 NOTE — Progress Notes (Signed)
  Subjective:    [name] is a 62 y.o. male who presents for follow-up of Type 2 diabetes mellitus.    Home blood sugar records: 121 AM   Current symptoms/problems FREQUENT URINE Daily foot checks: yes    Any foot concerns:  NONE Last eye exam:  09/2012 [facility]   Medication compliance: Current diet: DIABETIC  Current exercise: WALKING, BIKE  Known diabetic complications: none Cardiovascular risk factors: advanced age (older than 51 for men, 9 for women), diabetes mellitus, dyslipidemia, hypertension and male gender   The following portions of the patient's history were reviewed and updated as appropriate: allergies, current medications, past family history, past medical history, past social history and problem list.  ROS as in subjective above    Objective:   General appearence: alert, no distress, WD/WN Foot exam done and was normal Lab Review Lab Results  Component Value Date   HGBA1C 7.0 06/08/2012   Lab Results  Component Value Date   CHOL 159 06/19/2011   HDL 45 06/19/2011   LDLCALC 99 06/19/2011   TRIG 76 06/19/2011   CHOLHDL 3.5 06/19/2011   No results found for this basenameConcepcion Elk     Chemistry      Component Value Date/Time   NA 139 06/19/2011 0859   K 4.1 06/19/2011 0859   CL 106 06/19/2011 0859   CO2 26 06/19/2011 0859   BUN 28* 06/19/2011 0859   CREATININE 1.04 06/19/2011 0859      Component Value Date/Time   CALCIUM 9.3 06/19/2011 0859   ALKPHOS 52 06/19/2011 0859   AST 21 06/19/2011 0859   ALT 18 06/19/2011 0859   BILITOT 0.3 06/19/2011 0859        Chemistry      Component Value Date/Time   NA 139 06/19/2011 0859   K 4.1 06/19/2011 0859   CL 106 06/19/2011 0859   CO2 26 06/19/2011 0859   BUN 28* 06/19/2011 0859   CREATININE 1.04 06/19/2011 0859      Component Value Date/Time   CALCIUM 9.3 06/19/2011 0859   ALKPHOS 52 06/19/2011 0859   AST 21 06/19/2011 0859   ALT 18 06/19/2011 0859   BILITOT 0.3 06/19/2011 0859          Assessment:   Diabetes mellitus - Plan: POCT glycosylated hemoglobin (Hb A1C), CBC with Differential, Comprehensive metabolic panel, Lipid panel, POCT UA - Microalbumin  Hypertension associated with diabetes - Plan: CBC with Differential, Comprehensive metabolic panel  Hyperlipidemia LDL goal <70 - Plan: Lipid panel  ED (erectile dysfunction)  Encounter for long-term (current) use of other medications - Plan: CBC with Differential, Comprehensive metabolic panel, Lipid panel    Plan:    1.  Rx changes: none 2.  Education: Reviewed 'ABCs' of diabetes management (respective goals in parentheses):  A1C (<7), blood pressure (<130/80), and cholesterol (LDL <100). 3.  Compliance at present is estimated to be excellent. Efforts to improve compliance (if necessary) will be directed at none. 4. Follow up: 4 months

## 2012-10-15 NOTE — Progress Notes (Signed)
Quick Note:  Called pt cell left message blood work looks great ______

## 2013-02-14 ENCOUNTER — Encounter: Payer: Self-pay | Admitting: Family Medicine

## 2013-02-14 ENCOUNTER — Ambulatory Visit (INDEPENDENT_AMBULATORY_CARE_PROVIDER_SITE_OTHER): Payer: BC Managed Care – PPO | Admitting: Family Medicine

## 2013-02-14 VITALS — BP 132/80 | HR 82 | Wt 160.0 lb

## 2013-02-14 DIAGNOSIS — E1169 Type 2 diabetes mellitus with other specified complication: Secondary | ICD-10-CM

## 2013-02-14 DIAGNOSIS — E1159 Type 2 diabetes mellitus with other circulatory complications: Secondary | ICD-10-CM

## 2013-02-14 DIAGNOSIS — I10 Essential (primary) hypertension: Secondary | ICD-10-CM

## 2013-02-14 DIAGNOSIS — E119 Type 2 diabetes mellitus without complications: Secondary | ICD-10-CM

## 2013-02-14 DIAGNOSIS — E785 Hyperlipidemia, unspecified: Secondary | ICD-10-CM

## 2013-02-14 NOTE — Progress Notes (Signed)
  Subjective:    [name] is a 62 y.o. male who presents for follow-up of Type 2 diabetes mellitus. He recently quit his job. 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 He states that his right shoulder has given him trouble however when I ask what he wanted me to evaluate it he said he would wait. He has no other concerns or complaints.  Home blood sugar records: adv low 100s  Current symptoms/problems:right shoulder pain Daily foot checks: checks daily, no problems Last eye exam:  June 2014 @ [facility]   Medication compliance:good Current diet: none Current exercise: Yard work, walking Known diabetic complications: none Cardiovascular risk factors: advanced age (older than 42 for men, 75 for women), diabetes mellitus, dyslipidemia, hypertension and male gender   The following portions of the patient's history were reviewed and updated as appropriate: allergies, current medications, past family history, past medical history, past social history, past surgical history and problem list.  ROS as in subjective above    Objective:    General appearence: alert, no distress, WD/WN   Lab Review Lab Results  Component Value Date   HGBA1C 6.5 10/14/2012   Lab Results  Component Value Date   CHOL 126 10/14/2012   HDL 36* 10/14/2012   LDLCALC 64 10/14/2012   TRIG 130 10/14/2012   CHOLHDL 3.5 10/14/2012   No results found for this basenameConcepcion Elk     Chemistry      Component Value Date/Time   NA 137 10/14/2012 1005   K 4.2 10/14/2012 1005   CL 104 10/14/2012 1005   CO2 24 10/14/2012 1005   BUN 21 10/14/2012 1005   CREATININE 1.07 10/14/2012 1005      Component Value Date/Time   CALCIUM 9.6 10/14/2012 1005   ALKPHOS 54 10/14/2012 1005   AST 16 10/14/2012 1005   ALT 16 10/14/2012 1005   BILITOT 0.5 10/14/2012 1005        Chemistry      Component Value Date/Time   NA 137 10/14/2012 1005    K 4.2 10/14/2012 1005   CL 104 10/14/2012 1005   CO2 24 10/14/2012 1005   BUN 21 10/14/2012 1005   CREATININE 1.07 10/14/2012 1005      Component Value Date/Time   CALCIUM 9.6 10/14/2012 1005   ALKPHOS 54 10/14/2012 1005   AST 16 10/14/2012 1005   ALT 16 10/14/2012 1005   BILITOT 0.5 10/14/2012 1005    Hemoglobin A1c is 6.3       Assessment:   Encounter Diagnoses  Name Primary?  . Diabetes mellitus Yes  . Hypertension associated with diabetes   . Hyperlipidemia LDL goal <70          Plan:    1.  Rx changes: none 2.  Education: Reviewed 'ABCs' of diabetes management (respective goals in parentheses):  A1C (<7), blood pressure (<130/80), and cholesterol (LDL <100). 3.  Compliance at present is estimated to be good. Efforts to improve compliance (if necessary) will be directed at nothing. 4. Follow up: 4 months  I discussed the medications that he is on in regard to switching insurance. He is to keep this in mind when he sets himself up for insurance.

## 2013-03-17 ENCOUNTER — Other Ambulatory Visit: Payer: Self-pay | Admitting: Family Medicine

## 2013-03-17 DIAGNOSIS — E1159 Type 2 diabetes mellitus with other circulatory complications: Secondary | ICD-10-CM

## 2013-03-17 DIAGNOSIS — I10 Essential (primary) hypertension: Secondary | ICD-10-CM

## 2013-03-17 DIAGNOSIS — E785 Hyperlipidemia, unspecified: Secondary | ICD-10-CM

## 2013-03-17 DIAGNOSIS — E119 Type 2 diabetes mellitus without complications: Secondary | ICD-10-CM

## 2013-03-17 MED ORDER — ATORVASTATIN CALCIUM 40 MG PO TABS: 40.0000 mg | ORAL_TABLET | Freq: Every day | ORAL | Status: DC

## 2013-03-17 MED ORDER — ENALAPRIL MALEATE 5 MG PO TABS: 5.0000 mg | ORAL_TABLET | Freq: Every day | ORAL | Status: DC

## 2013-03-17 MED ORDER — SITAGLIPTIN PHOSPHATE 100 MG PO TABS: 100.0000 mg | ORAL_TABLET | Freq: Every day | ORAL | Status: DC

## 2013-03-17 NOTE — Telephone Encounter (Signed)
Pt wants refills on meds so they can get 90 day supply

## 2013-05-27 ENCOUNTER — Ambulatory Visit: Payer: BC Managed Care – PPO | Admitting: Family Medicine

## 2013-05-31 ENCOUNTER — Ambulatory Visit: Payer: BC Managed Care – PPO | Admitting: Family Medicine

## 2013-06-21 ENCOUNTER — Ambulatory Visit (INDEPENDENT_AMBULATORY_CARE_PROVIDER_SITE_OTHER): Payer: 59 | Admitting: Family Medicine

## 2013-06-21 ENCOUNTER — Encounter: Payer: Self-pay | Admitting: Family Medicine

## 2013-06-21 VITALS — BP 130/80 | HR 64 | Wt 166.0 lb

## 2013-06-21 DIAGNOSIS — E785 Hyperlipidemia, unspecified: Secondary | ICD-10-CM

## 2013-06-21 DIAGNOSIS — E1169 Type 2 diabetes mellitus with other specified complication: Secondary | ICD-10-CM

## 2013-06-21 DIAGNOSIS — E119 Type 2 diabetes mellitus without complications: Secondary | ICD-10-CM

## 2013-06-21 DIAGNOSIS — I10 Essential (primary) hypertension: Secondary | ICD-10-CM

## 2013-06-21 DIAGNOSIS — E1159 Type 2 diabetes mellitus with other circulatory complications: Secondary | ICD-10-CM

## 2013-06-21 LAB — POCT GLYCOSYLATED HEMOGLOBIN (HGB A1C)

## 2013-06-21 NOTE — Progress Notes (Signed)
   Subjective:    Patient ID: [name], male    DOB: 1950/12/25, 63 y.o.   MRN: [id number]  HPI He is here for a diabetes recheck. He has quit his job and presently does not have insurance. He does plan on getting it sometime in the near future. He continues on medications listed in the chart. He does intermittently check his blood sugar. His physical activity has diminished since he quit his job. He does check his feet regularly. He is not sure when he had his last eye exam. Smoking and drinking were reviewed.   Review of Systems     Objective:   Physical Exam Alert and in no distress. Hemoglobin A1c 7.4       Assessment & Plan:  Diabetes mellitus - Plan: POCT glycosylated hemoglobin (Hb A1C)  Hyperlipidemia LDL goal <70  Hypertension associated with diabetes  he does plan on doing a better job of taking care of himself. He does admit to not being as compliant as he should be. Recheck here in 4 months

## 2013-08-06 ENCOUNTER — Telehealth: Payer: Self-pay | Admitting: Family Medicine

## 2013-08-11 NOTE — Telephone Encounter (Signed)
Recv'd note from [facility] states pt insurance not active, left message for pt to see if he has new ins

## 2013-08-12 ENCOUNTER — Telehealth: Payer: Self-pay

## 2013-08-12 NOTE — Telephone Encounter (Signed)
Pt wife called stating they have new insurance which is now [facility]

## 2013-08-17 MED ORDER — SITAGLIPTIN PHOSPHATE 100 MG PO TABS: 100.0000 mg | ORAL_TABLET | Freq: Every day | ORAL | Status: DC

## 2013-08-17 NOTE — Telephone Encounter (Signed)
Called [facility] to initiate P.A. Januvia, will take 2 days for response.  Samples Januvia 100mg  #14 given per Andria Frames

## 2013-08-18 NOTE — Telephone Encounter (Signed)
P.A. JANUVIA denied, pt needs 3 month trial of Onglyza, Tradjenta, or Nesina. Do you want to switch?

## 2013-08-22 NOTE — Telephone Encounter (Signed)
This one is yours

## 2013-08-23 ENCOUNTER — Ambulatory Visit (INDEPENDENT_AMBULATORY_CARE_PROVIDER_SITE_OTHER): Payer: 59 | Admitting: Family Medicine

## 2013-08-23 DIAGNOSIS — E119 Type 2 diabetes mellitus without complications: Secondary | ICD-10-CM

## 2013-08-23 MED ORDER — LINAGLIPTIN 5 MG PO TABS: 5.0000 mg | ORAL_TABLET | Freq: Every day | ORAL | Status: DC

## 2013-08-23 NOTE — Progress Notes (Signed)
   Subjective:    Patient ID: [name], male    DOB: [birth year], 63 y.o.   MRN: [id number]  HPI He is here for consultation. He was originally placed on Januvia however his insurance would not cover that.   Review of Systems     Objective:   Physical Exam Alert and in no distress otherwise not examined       Assessment & Plan:  Type 2 diabetes mellitus without complication - Plan: linagliptin (TRADJENTA) 5 MG TABS tablet, linagliptin (TRADJENTA) 5 MG TABS tablet  I will switch him to another drug from the same class. Discussed possible side effects. A sample was given. He has no difficulty we'll get the prescription filled and return here for regular visit.

## 2013-08-23 NOTE — Telephone Encounter (Signed)
Have him set up an appointment so we can discuss this

## 2013-08-23 NOTE — Telephone Encounter (Signed)
Pt has appointment to be seen today

## 2013-09-26 ENCOUNTER — Ambulatory Visit: Payer: Self-pay | Admitting: Family Medicine

## 2013-10-24 ENCOUNTER — Ambulatory Visit: Payer: 59 | Admitting: Family Medicine

## 2013-11-01 ENCOUNTER — Ambulatory Visit: Payer: 59 | Admitting: Family Medicine

## 2013-11-10 ENCOUNTER — Encounter: Payer: Self-pay | Admitting: Family Medicine

## 2013-11-10 ENCOUNTER — Ambulatory Visit (INDEPENDENT_AMBULATORY_CARE_PROVIDER_SITE_OTHER): Payer: 59 | Admitting: Family Medicine

## 2013-11-10 VITALS — BP 130/80 | HR 66 | Wt 155.0 lb

## 2013-11-10 DIAGNOSIS — I10 Essential (primary) hypertension: Secondary | ICD-10-CM

## 2013-11-10 DIAGNOSIS — E1159 Type 2 diabetes mellitus with other circulatory complications: Secondary | ICD-10-CM

## 2013-11-10 DIAGNOSIS — Z87891 Personal history of nicotine dependence: Secondary | ICD-10-CM | POA: Insufficient documentation

## 2013-11-10 DIAGNOSIS — I152 Hypertension secondary to endocrine disorders: Secondary | ICD-10-CM

## 2013-11-10 DIAGNOSIS — E1169 Type 2 diabetes mellitus with other specified complication: Secondary | ICD-10-CM

## 2013-11-10 DIAGNOSIS — Z23 Encounter for immunization: Secondary | ICD-10-CM

## 2013-11-10 DIAGNOSIS — F17200 Nicotine dependence, unspecified, uncomplicated: Secondary | ICD-10-CM

## 2013-11-10 DIAGNOSIS — E785 Hyperlipidemia, unspecified: Secondary | ICD-10-CM

## 2013-11-10 DIAGNOSIS — E119 Type 2 diabetes mellitus without complications: Secondary | ICD-10-CM

## 2013-11-10 LAB — CBC WITH DIFFERENTIAL/PLATELET
Basophils Absolute: 0 10*3/uL (ref 0.0–0.1)
Basophils Relative: 0 % (ref 0–1)
EOS PCT: 6 % — AB (ref 0–5)
Eosinophils Absolute: 0.5 10*3/uL (ref 0.0–0.7)
HEMATOCRIT: 42.3 % (ref 39.0–52.0)
Hemoglobin: 14.5 g/dL (ref 13.0–17.0)
LYMPHS ABS: 2.5 10*3/uL (ref 0.7–4.0)
LYMPHS PCT: 32 % (ref 12–46)
MCH: 28.8 pg (ref 26.0–34.0)
MCHC: 34.3 g/dL (ref 30.0–36.0)
MCV: 84.1 fL (ref 78.0–100.0)
MONO ABS: 0.5 10*3/uL (ref 0.1–1.0)
Monocytes Relative: 7 % (ref 3–12)
Neutro Abs: 4.3 10*3/uL (ref 1.7–7.7)
Neutrophils Relative %: 55 % (ref 43–77)
PLATELETS: 237 10*3/uL (ref 150–400)
RBC: 5.03 MIL/uL (ref 4.22–5.81)
RDW: 15.5 % (ref 11.5–15.5)
WBC: 7.8 10*3/uL (ref 4.0–10.5)

## 2013-11-10 LAB — COMPREHENSIVE METABOLIC PANEL
ALT: 17 U/L (ref 0–53)
AST: 15 U/L (ref 0–37)
Albumin: 4.1 g/dL (ref 3.5–5.2)
Alkaline Phosphatase: 54 U/L (ref 39–117)
BUN: 22 mg/dL (ref 6–23)
CALCIUM: 9.2 mg/dL (ref 8.4–10.5)
CHLORIDE: 107 meq/L (ref 96–112)
CO2: 24 mEq/L (ref 19–32)
CREATININE: 1.1 mg/dL (ref 0.50–1.35)
GLUCOSE: 125 mg/dL — AB (ref 70–99)
Potassium: 4.3 mEq/L (ref 3.5–5.3)
Sodium: 138 mEq/L (ref 135–145)
Total Bilirubin: 0.4 mg/dL (ref 0.2–1.2)
Total Protein: 6.8 g/dL (ref 6.0–8.3)

## 2013-11-10 LAB — LIPID PANEL
CHOLESTEROL: 137 mg/dL (ref 0–200)
HDL: 40 mg/dL (ref >39)
LDL Cholesterol: 85 mg/dL (ref 0–99)
Total CHOL/HDL Ratio: 3.4 Ratio
Triglycerides: 61 mg/dL (ref <150)
VLDL: 12 mg/dL (ref 0–40)

## 2013-11-10 NOTE — Progress Notes (Signed)
  Subjective:    [name] is a 63 y.o. male who presents for follow-up of Type 2 diabetes mellitus.    Home blood sugar records: PT TEST ONE X A DAY 105  Current symptoms/problems NONE Daily foot checks:    Any foot concerns:  NONE Last eye exam:  2014 [facility]   Medication compliance: Good Current diet: WATCHING WHAT HE EATS. He has made a concerted effort to lose weight by making dietary and exercise changes. Current exercise: RIDE BIKE  Known diabetic complications: none Cardiovascular risk factors: advanced age (older than 28 for men, 50 for women), diabetes mellitus, dyslipidemia, hypertension and male gender He does occasionally smoke   ROS as in subjective above    Objective:    Wt 155 lb (70.308 kg)   General appearence: alert, no distress, WD/WN Ext: no edema Foot exam:  Neuro: foot monofilament exam normal   Lab Review Lab Results  Component Value Date   HGBA1C 7.4% 06/21/2013   Lab Results  Component Value Date   CHOL 126 10/14/2012   HDL 36* 10/14/2012   LDLCALC 64 10/14/2012   TRIG 130 10/14/2012   CHOLHDL 3.5 10/14/2012   No results found for this basename: Derl Barrow     Chemistry      Component Value Date/Time   NA 137 10/14/2012 1005   K 4.2 10/14/2012 1005   CL 104 10/14/2012 1005   CO2 24 10/14/2012 1005   BUN 21 10/14/2012 1005   CREATININE 1.07 10/14/2012 1005      Component Value Date/Time   CALCIUM 9.6 10/14/2012 1005   ALKPHOS 54 10/14/2012 1005   AST 16 10/14/2012 1005   ALT 16 10/14/2012 1005   BILITOT 0.5 10/14/2012 1005        Chemistry      Component Value Date/Time   NA 137 10/14/2012 1005   K 4.2 10/14/2012 1005   CL 104 10/14/2012 1005   CO2 24 10/14/2012 1005   BUN 21 10/14/2012 1005   CREATININE 1.07 10/14/2012 1005      Component Value Date/Time   CALCIUM 9.6 10/14/2012 1005   ALKPHOS 54 10/14/2012 1005   AST 16 10/14/2012 1005   ALT 16 10/14/2012 1005   BILITOT 0.5 10/14/2012 1005       He does  have an eye exam set up in the near future. Hemoglobin A1c is 6.1   Assessment:   Encounter Diagnoses  Name Primary?  . Need for prophylactic vaccination and inoculation against influenza Yes  . Need for prophylactic vaccination against Streptococcus pneumoniae (pneumococcus)   . Hypertension associated with diabetes   . Hyperlipidemia LDL goal <70   . Type 2 diabetes mellitus without complication   . Current smoker on some days    colonoscopy offered and  he refused       Plan:    1.  Rx changes: none 2.  Education: Reviewed 'ABCs' of diabetes management (respective goals in parentheses):  A1C (<7), blood pressure (<130/80), and cholesterol (LDL <100). 3.  Compliance at present is estimated to be good. Efforts to improve compliance (if necessary) will be directed at No changes. 4. Follow up: 4 months

## 2014-02-24 DIAGNOSIS — K5790 Diverticulosis of intestine, part unspecified, without perforation or abscess without bleeding: Secondary | ICD-10-CM

## 2014-02-24 HISTORY — DX: Diverticulosis of intestine, part unspecified, without perforation or abscess without bleeding: K57.90

## 2014-03-13 ENCOUNTER — Ambulatory Visit (INDEPENDENT_AMBULATORY_CARE_PROVIDER_SITE_OTHER): Payer: 59 | Admitting: Family Medicine

## 2014-03-13 ENCOUNTER — Encounter: Payer: Self-pay | Admitting: Family Medicine

## 2014-03-13 VITALS — BP 118/70 | HR 86 | Wt 167.0 lb

## 2014-03-13 DIAGNOSIS — N522 Drug-induced erectile dysfunction: Secondary | ICD-10-CM

## 2014-03-13 DIAGNOSIS — E1159 Type 2 diabetes mellitus with other circulatory complications: Secondary | ICD-10-CM

## 2014-03-13 DIAGNOSIS — F17200 Nicotine dependence, unspecified, uncomplicated: Secondary | ICD-10-CM

## 2014-03-13 DIAGNOSIS — E1169 Type 2 diabetes mellitus with other specified complication: Secondary | ICD-10-CM

## 2014-03-13 DIAGNOSIS — Z720 Tobacco use: Secondary | ICD-10-CM

## 2014-03-13 DIAGNOSIS — I10 Essential (primary) hypertension: Secondary | ICD-10-CM

## 2014-03-13 DIAGNOSIS — E119 Type 2 diabetes mellitus without complications: Secondary | ICD-10-CM

## 2014-03-13 DIAGNOSIS — E785 Hyperlipidemia, unspecified: Secondary | ICD-10-CM

## 2014-03-13 DIAGNOSIS — I152 Hypertension secondary to endocrine disorders: Secondary | ICD-10-CM

## 2014-03-13 LAB — POCT GLYCOSYLATED HEMOGLOBIN (HGB A1C): HEMOGLOBIN A1C: 7

## 2014-03-13 NOTE — Progress Notes (Signed)
Subjective:    [name] is a 64 y.o. male who presents for follow-up of Type 2 diabetes mellitus.  He is retired but keeps himself very busy with various activities. He does not smoke cigarettes but does have an occasional marijuana  Home blood sugar records: Patient test one time a day  Current symptoms/problems NONE Daily foot checks: Any foot concerns: NONE Last eye exam:  ?   Medication compliance: Excellent Current diet: none Current exercise: ride bike work in yard  Known diabetic complications: none Cardiovascular risk factors: advanced age (older than 26 for men, 40 for women), diabetes mellitus, dyslipidemia, hypertension and male gender   The following portions of the patient's history were reviewed and updated as appropriate: allergies, current medications, past medical history, past social history and problem list.  ROS as in subjective above    Objective:    There were no vitals taken for this visit.  There were no vitals filed for this visit.  General appearence: alert, no distress, WD/WN Neck: supple, no lymphadenopathy, no thyromegaly, no masses Heart: RRR, normal S1, S2, no murmurs Lungs: CTA bilaterally, no wheezes, rhonchi, or rales Abdomen: +bs, soft, non tender, non distended, no masses, no hepatomegaly, no splenomegaly Pulses: 2+ symmetric, upper and lower extremities, normal cap refill Ext: no edema Foot exam:  Neuro: foot monofilament exam normal   Lab Review Lab Results  Component Value Date   HGBA1C 7.4% 06/21/2013   Lab Results  Component Value Date   CHOL 137 11/10/2013   HDL 40 11/10/2013   LDLCALC 85 11/10/2013   TRIG 61 11/10/2013   CHOLHDL 3.4 11/10/2013   No results found for: Derl Barrow   Chemistry      Component Value Date/Time   NA 138 11/10/2013 1106   K 4.3 11/10/2013 1106   CL 107 11/10/2013 1106   CO2 24 11/10/2013 1106   BUN 22 11/10/2013 1106   CREATININE 1.10 11/10/2013 1106      Component  Value Date/Time   CALCIUM 9.2 11/10/2013 1106   ALKPHOS 54 11/10/2013 1106   AST 15 11/10/2013 1106   ALT 17 11/10/2013 1106   BILITOT 0.4 11/10/2013 1106        Chemistry      Component Value Date/Time   NA 138 11/10/2013 1106   K 4.3 11/10/2013 1106   CL 107 11/10/2013 1106   CO2 24 11/10/2013 1106   BUN 22 11/10/2013 1106   CREATININE 1.10 11/10/2013 1106      Component Value Date/Time   CALCIUM 9.2 11/10/2013 1106   ALKPHOS 54 11/10/2013 1106   AST 15 11/10/2013 1106   ALT 17 11/10/2013 1106   BILITOT 0.4 11/10/2013 1106       Hemoglobin A1c 7.0:    Assessment:   Encounter Diagnoses  Name Primary?  . Current smoker on some days Yes  . Type 2 diabetes mellitus without complication   . Drug-induced erectile dysfunction   . Hyperlipidemia LDL goal <70   . Hypertension associated with diabetes          Plan:    1.  Rx changes: none 2.  Education: Reviewed 'ABCs' of diabetes management (respective goals in parentheses):  A1C (<7), blood pressure (<130/80), and cholesterol (LDL <100). 3.  Compliance at present is estimated to be good. Efforts to improve compliance (if necessary) will be directed at No change . 4. Follow up: 4 months   He is doing a good job of taking care of  himself. I continue with his present regimen.

## 2014-04-06 ENCOUNTER — Other Ambulatory Visit: Payer: Self-pay | Admitting: Family Medicine

## 2014-07-13 ENCOUNTER — Ambulatory Visit: Payer: 59 | Admitting: Family Medicine

## 2014-08-10 ENCOUNTER — Encounter: Payer: Self-pay | Admitting: Family Medicine

## 2014-08-10 ENCOUNTER — Ambulatory Visit (INDEPENDENT_AMBULATORY_CARE_PROVIDER_SITE_OTHER): Payer: 59 | Admitting: Family Medicine

## 2014-08-10 VITALS — BP 122/70 | HR 74 | Ht 69.0 in | Wt 157.0 lb

## 2014-08-10 DIAGNOSIS — I152 Hypertension secondary to endocrine disorders: Secondary | ICD-10-CM

## 2014-08-10 DIAGNOSIS — F17200 Nicotine dependence, unspecified, uncomplicated: Secondary | ICD-10-CM

## 2014-08-10 DIAGNOSIS — E119 Type 2 diabetes mellitus without complications: Secondary | ICD-10-CM

## 2014-08-10 DIAGNOSIS — E1169 Type 2 diabetes mellitus with other specified complication: Secondary | ICD-10-CM

## 2014-08-10 DIAGNOSIS — Z720 Tobacco use: Secondary | ICD-10-CM | POA: Diagnosis not present

## 2014-08-10 DIAGNOSIS — E1159 Type 2 diabetes mellitus with other circulatory complications: Secondary | ICD-10-CM

## 2014-08-10 DIAGNOSIS — I10 Essential (primary) hypertension: Secondary | ICD-10-CM

## 2014-08-10 DIAGNOSIS — E785 Hyperlipidemia, unspecified: Secondary | ICD-10-CM

## 2014-08-10 LAB — HM DIABETES EYE EXAM

## 2014-08-10 LAB — POCT GLYCOSYLATED HEMOGLOBIN (HGB A1C): HEMOGLOBIN A1C: 6.8

## 2014-08-10 NOTE — Progress Notes (Signed)
  Subjective:    Patient ID: [name], male    DOB: 1950/10/27, 64 y.o.   MRN: [id number]  THANE AGE is a 64 y.o. male who presents for follow-up of Type 2 diabetes mellitus.He has been dealing with stress with the death of 2 people in his family and he also had some of his teeth removed and is getting partials.  Home blood sugar records: Patient test one time a day Current symptoms/problems none Daily foot checks:yes   Any foot concerns: none Exercise: bike, hay mowing grass Eyes: 08/09/14 [employer] The following portions of the patient's history were reviewed and updated as appropriate: allergies, current medications, past medical history, past social history and problem list.  ROS as in subjective above.     Objective:    Physical Exam Alert and in no distress otherwise not examined.   Lab Review Diabetic Labs Latest Ref Rng 03/13/2014 11/10/2013 06/21/2013 02/14/2013 10/14/2012  HbA1c - 7.0 - 7.4% 6.3 6.5  Chol 0 - 200 mg/dL - 137 - - 126  HDL >39 mg/dL - 40 - - 36(L)  Calc LDL 0 - 99 mg/dL - 85 - - 64  Triglycerides <150 mg/dL - 61 - - 130  Creatinine 0.50 - 1.35 mg/dL - 1.10 - - 1.07   BP/Weight 03/13/2014 11/10/2013 06/21/2013 02/14/2013 1/70/0174  Systolic BP 944 967 591 638 466  Diastolic BP 70 80 80 80 70  Wt. (Lbs) 167 155 166 160 155  BMI 24.65 22.88 24.5 23.62 22.88   Foot/eye exam completion dates 11/10/2013 10/14/2012  Foot Form Completion Done Done  Hemoglobin A1c 6.8  Taje  reports that he has been smoking.  He has never used smokeless tobacco. He reports that he uses illicit drugs (Marijuana). He reports that he does not drink alcohol.     Assessment & Plan:    Diabetes mellitus type 2, controlled - Plan: POCT glycosylated hemoglobin (Hb A1C)  Hypertension associated with diabetes  Hyperlipidemia associated with type 2 diabetes mellitus  Current smoker on some days   1. Rx changes: none 2. Education: Reviewed 'ABCs' of diabetes  management (respective goals in parentheses):  A1C (<7), blood pressure (<130/80), and cholesterol (LDL <100). 3. Compliance at present is estimated to be good. Efforts to improve compliance (if necessary) will be directed at no change. 4. Follow up: 4 months  5. I again encouraged him to quit smoking entirely.

## 2014-08-31 ENCOUNTER — Other Ambulatory Visit: Payer: Self-pay | Admitting: Family Medicine

## 2014-10-09 ENCOUNTER — Telehealth: Payer: Self-pay | Admitting: Family Medicine

## 2014-10-09 NOTE — Telephone Encounter (Signed)
Pt called and was wanting a referral for a colostomy, said him and dr Redmond [facility] dicussed it last time, said if you could make the appt for a Thursday,  Thank u :):)

## 2014-10-10 ENCOUNTER — Other Ambulatory Visit: Payer: Self-pay

## 2014-10-10 DIAGNOSIS — Z1211 Encounter for screening for malignant neoplasm of colon: Secondary | ICD-10-CM

## 2014-10-10 NOTE — Telephone Encounter (Signed)
I have put referral in epic

## 2014-10-13 ENCOUNTER — Encounter: Payer: Self-pay | Admitting: Gastroenterology

## 2014-12-07 ENCOUNTER — Ambulatory Visit (AMBULATORY_SURGERY_CENTER): Payer: Self-pay

## 2014-12-07 VITALS — Ht 71.0 in | Wt 153.6 lb

## 2014-12-07 DIAGNOSIS — Z1211 Encounter for screening for malignant neoplasm of colon: Secondary | ICD-10-CM

## 2014-12-07 NOTE — Progress Notes (Signed)
No allergies to eggs or soy No diet/weight loss meds No home oxygen No past problems with anesthesia  Refused emmi; "i dont want to see that"

## 2014-12-21 ENCOUNTER — Ambulatory Visit (AMBULATORY_SURGERY_CENTER): Payer: 59 | Admitting: Gastroenterology

## 2014-12-21 VITALS — BP 156/79 | HR 53 | Temp 96.4°F | Resp 20 | Ht 71.0 in | Wt 153.0 lb

## 2014-12-21 DIAGNOSIS — Z1211 Encounter for screening for malignant neoplasm of colon: Secondary | ICD-10-CM

## 2014-12-21 DIAGNOSIS — D122 Benign neoplasm of ascending colon: Secondary | ICD-10-CM

## 2014-12-21 DIAGNOSIS — D128 Benign neoplasm of rectum: Secondary | ICD-10-CM | POA: Diagnosis not present

## 2014-12-21 DIAGNOSIS — D129 Benign neoplasm of anus and anal canal: Secondary | ICD-10-CM

## 2014-12-21 DIAGNOSIS — D123 Benign neoplasm of transverse colon: Secondary | ICD-10-CM | POA: Diagnosis not present

## 2014-12-21 MED ORDER — SODIUM CHLORIDE 0.9 % IV SOLN: 500.0000 mL | INTRAVENOUS | Status: DC

## 2014-12-21 NOTE — Patient Instructions (Signed)
Impressions/recommendations:  Polyps (handout given) Diverticulosis (handout given) High Fiber Diet (handout given)  YOU HAD AN ENDOSCOPIC PROCEDURE TODAY AT THE [facility]:   Refer to the procedure report that was given to you for any specific questions about what was found during the examination.  If the procedure report does not answer your questions, please call your gastroenterologist to clarify.  If you requested that your care partner not be given the details of your procedure findings, then the procedure report has been included in a sealed envelope for you to review at your convenience later.  YOU SHOULD EXPECT: Some feelings of bloating in the abdomen. Passage of more gas than usual.  Walking can help get rid of the air that was put into your GI tract during the procedure and reduce the bloating. If you had a lower endoscopy (such as a colonoscopy or flexible sigmoidoscopy) you may notice spotting of blood in your stool or on the toilet paper. If you underwent a bowel prep for your procedure, you may not have a normal bowel movement for a few days.  Please Note:  You might notice some irritation and congestion in your nose or some drainage.  This is from the oxygen used during your procedure.  There is no need for concern and it should clear up in a day or so.  SYMPTOMS TO REPORT IMMEDIATELY:   Following lower endoscopy (colonoscopy or flexible sigmoidoscopy):  Excessive amounts of blood in the stool  Significant tenderness or worsening of abdominal pains  Swelling of the abdomen that is new, acute  Fever of 100F or higher  For urgent or emergent issues, a gastroenterologist can be reached at any hour by calling [phone].   DIET: Your first meal following the procedure should be a small meal and then it is ok to progress to your normal diet. Heavy or fried foods are harder to digest and may make you feel nauseous or bloated.  Likewise, meals heavy in dairy and  vegetables can increase bloating.  Drink plenty of fluids but you should avoid alcoholic beverages for 24 hours.  ACTIVITY:  You should plan to take it easy for the rest of today and you should NOT DRIVE or use heavy machinery until tomorrow (because of the sedation medicines used during the test).    FOLLOW UP: Our staff will call the number listed on your records the next business day following your procedure to check on you and address any questions or concerns that you may have regarding the information given to you following your procedure. If we do not reach you, we will leave a message.  However, if you are feeling well and you are not experiencing any problems, there is no need to return our call.  We will assume that you have returned to your regular daily activities without incident.  If any biopsies were taken you will be contacted by phone or by letter within the next 1-3 weeks.  Please call us at [phone] if you have not heard about the biopsies in 3 weeks.    SIGNATURES/CONFIDENTIALITY: You and/or your care partner have signed paperwork which will be entered into your electronic medical record.  These signatures attest to the fact that that the information above on your After Visit Summary has been reviewed and is understood.  Full responsibility of the confidentiality of this discharge information lies with you and/or your care-partner. 

## 2014-12-21 NOTE — Progress Notes (Signed)
To PACU  Awake and alert, report to RN 

## 2014-12-21 NOTE — Progress Notes (Signed)
Called to room to assist during endoscopic procedure.  Patient ID and intended procedure confirmed with present staff. Received instructions for my participation in the procedure from the performing physician.  

## 2014-12-21 NOTE — Op Note (Signed)
[facility]  [address] [place], [id number]   COLONOSCOPY PROCEDURE REPORT  PATIENT: James, Ramirez  MR#: [id number] BIRTHDATE: 1950/09/03 , 64  yrs. old GENDER: male ENDOSCOPIST: Yetta Flock, MD REFERRED BY: Jill Alexanders MD PROCEDURE DATE:  12/21/2014 PROCEDURE:   Colonoscopy, screening and Colonoscopy with biopsy First Screening Colonoscopy - Avg.  risk and is 50 yrs.  old or older Yes.  Prior Negative Screening - Now for repeat screening. N/A  History of Adenoma - Now for follow-up colonoscopy & has been > or = to 3 yrs.  N/A  Polyps removed today? Yes ASA CLASS:   Class II INDICATIONS:Screening for colonic neoplasia and Colorectal Neoplasm Risk Assessment for this procedure is average risk. MEDICATIONS: Propofol 450 mg IV  DESCRIPTION OF PROCEDURE:   After the risks benefits and alternatives of the procedure were thoroughly explained, informed consent was obtained.  The digital rectal exam revealed no abnormalities of the rectum.   The [id number]  endoscope was introduced through the anus and advanced to the cecum, which was identified by both the appendix and ileocecal valve. No adverse events experienced.   The quality of the prep was adequate  The instrument was then slowly withdrawn as the colon was fully examined. Estimated blood loss is zero unless otherwise noted in this procedure report.  COLON FINDINGS: There was severe diverticulosis noted in the sigmoid colon with sharp, agulated turns.  The adult colonoscope could not traverse the rectosigmoid colon in this light, thus a pediatric colonoscope was used to complete the exam.  There was a 75mm sessile polyp removed from the ascending colon with cold forceps.  Three x 60mm sessile polyps were noted in the transverse colon and removed with cold forceps.  A 11mm sessile polyp was noted in the rectum and removed with cold forceps.  Mild diverticulitis was noted in the right colon  as well.  Retroflexed views revealed no abnormalities. The time to cecum = 13.5 Withdrawal time = 19.4   The scope was withdrawn and the procedure completed. COMPLICATIONS: There were no immediate complications.  ENDOSCOPIC IMPRESSION: A few small polyps removed as described above Severe left sided diverticulosis, requiring use of pediatric colonoscope to traverse this part of the colon. Mild right sided diverticulosis  RECOMMENDATIONS: Await pathology results Resume medications Resume diet, high fiber diet   eSigned:  Yetta Flock, MD 12/21/2014 11:32 AM   cc: Jill Alexanders MD, the patient   PATIENT NAME:  [name], [name] MR#: [id number]

## 2014-12-22 ENCOUNTER — Telehealth: Payer: Self-pay

## 2014-12-22 NOTE — Telephone Encounter (Signed)
  Follow up Call-  Call back number 12/21/2014  Post procedure Call Back phone  # [phone]  Permission to leave phone message Yes     Patient questions:  Do you have a fever, pain , or abdominal swelling? No. Pain Score  0 *  Have you tolerated food without any problems? Yes.    Have you been able to return to your normal activities? Yes.    Do you have any questions about your discharge instructions: Diet   Yes.   Medications  No. Follow up visit  No.  Do you have questions or concerns about your Care? No.  Actions: * If pain score is 4 or above: No action needed, pain <4.

## 2014-12-27 ENCOUNTER — Encounter: Payer: Self-pay | Admitting: Gastroenterology

## 2015-02-28 ENCOUNTER — Other Ambulatory Visit: Payer: Self-pay | Admitting: Family Medicine

## 2015-03-18 ENCOUNTER — Telehealth: Payer: Self-pay | Admitting: Family Medicine

## 2015-03-21 NOTE — Telephone Encounter (Signed)
Faxed P.A. Tradjenta

## 2015-03-23 NOTE — Telephone Encounter (Signed)
P.A. Isabelle Course, pt must try Onglyza.  Do you want to switch?

## 2015-03-24 MED ORDER — SAXAGLIPTIN HCL 5 MG PO TABS
5.0000 mg | ORAL_TABLET | Freq: Every day | ORAL | Status: DC
Start: 2015-03-24 — End: 2015-12-22

## 2015-03-24 NOTE — Telephone Encounter (Signed)
Let him know that I called the med in and make sure that he had a follow up appt

## 2015-03-26 ENCOUNTER — Telehealth: Payer: Self-pay | Admitting: Family Medicine

## 2015-03-26 MED ORDER — ATORVASTATIN CALCIUM 40 MG PO TABS: 40.0000 mg | ORAL_TABLET | Freq: Every day | ORAL | Status: DC

## 2015-03-26 MED ORDER — ENALAPRIL MALEATE 5 MG PO TABS: 5.0000 mg | ORAL_TABLET | Freq: Every day | ORAL | Status: DC

## 2015-03-26 NOTE — Telephone Encounter (Signed)
He needs a follow-up appointment

## 2015-03-26 NOTE — Telephone Encounter (Signed)
Spoke with patient informed that med has been called in & that a follow up appointment is needed.  He said e will have his wife call tomorrow 1/31 to make a appointment.

## 2015-03-26 NOTE — Telephone Encounter (Signed)
Wife informed of medication change but was sent to wrong pharmacy was supposed to go to [facility] because she can no longer use [facility] with her insurance   I called & switched the pharmacy.   Wife also asked that refills Atorvastatin & Enalapril be refilled to [facility] also

## 2015-03-26 NOTE — Telephone Encounter (Signed)
Patient needs an appointment

## 2015-03-27 NOTE — Telephone Encounter (Signed)
Spoke with patient he has a follow appointment on 2/27

## 2015-04-23 ENCOUNTER — Encounter: Payer: Self-pay | Admitting: Family Medicine

## 2015-04-23 ENCOUNTER — Ambulatory Visit (INDEPENDENT_AMBULATORY_CARE_PROVIDER_SITE_OTHER): Payer: BLUE CROSS/BLUE SHIELD | Admitting: Family Medicine

## 2015-04-23 VITALS — BP 150/88 | HR 77 | Wt 162.4 lb

## 2015-04-23 DIAGNOSIS — Z720 Tobacco use: Secondary | ICD-10-CM | POA: Diagnosis not present

## 2015-04-23 DIAGNOSIS — E1169 Type 2 diabetes mellitus with other specified complication: Secondary | ICD-10-CM | POA: Diagnosis not present

## 2015-04-23 DIAGNOSIS — N529 Male erectile dysfunction, unspecified: Secondary | ICD-10-CM

## 2015-04-23 DIAGNOSIS — E785 Hyperlipidemia, unspecified: Secondary | ICD-10-CM

## 2015-04-23 DIAGNOSIS — E1159 Type 2 diabetes mellitus with other circulatory complications: Secondary | ICD-10-CM

## 2015-04-23 DIAGNOSIS — F17200 Nicotine dependence, unspecified, uncomplicated: Secondary | ICD-10-CM

## 2015-04-23 DIAGNOSIS — I152 Hypertension secondary to endocrine disorders: Secondary | ICD-10-CM

## 2015-04-23 DIAGNOSIS — I10 Essential (primary) hypertension: Secondary | ICD-10-CM | POA: Diagnosis not present

## 2015-04-23 DIAGNOSIS — Z1159 Encounter for screening for other viral diseases: Secondary | ICD-10-CM | POA: Diagnosis not present

## 2015-04-23 DIAGNOSIS — N528 Other male erectile dysfunction: Secondary | ICD-10-CM

## 2015-04-23 DIAGNOSIS — E118 Type 2 diabetes mellitus with unspecified complications: Secondary | ICD-10-CM | POA: Diagnosis not present

## 2015-04-23 DIAGNOSIS — Z1211 Encounter for screening for malignant neoplasm of colon: Secondary | ICD-10-CM | POA: Diagnosis not present

## 2015-04-23 LAB — CBC WITH DIFFERENTIAL/PLATELET
BASOS ABS: 0 10*3/uL (ref 0.0–0.1)
Basophils Relative: 0 % (ref 0–1)
EOS ABS: 0.4 10*3/uL (ref 0.0–0.7)
EOS PCT: 4 % (ref 0–5)
HCT: 44.3 % (ref 39.0–52.0)
Hemoglobin: 14.9 g/dL (ref 13.0–17.0)
Lymphocytes Relative: 26 % (ref 12–46)
Lymphs Abs: 2.3 10*3/uL (ref 0.7–4.0)
MCH: 29.3 pg (ref 26.0–34.0)
MCHC: 33.6 g/dL (ref 30.0–36.0)
MCV: 87 fL (ref 78.0–100.0)
MPV: 10.3 fL (ref 8.6–12.4)
Monocytes Absolute: 0.7 10*3/uL (ref 0.1–1.0)
Monocytes Relative: 8 % (ref 3–12)
Neutro Abs: 5.5 10*3/uL (ref 1.7–7.7)
Neutrophils Relative %: 62 % (ref 43–77)
PLATELETS: 218 10*3/uL (ref 150–400)
RBC: 5.09 MIL/uL (ref 4.22–5.81)
RDW: 14.7 % (ref 11.5–15.5)
WBC: 8.9 10*3/uL (ref 4.0–10.5)

## 2015-04-23 LAB — POCT GLYCOSYLATED HEMOGLOBIN (HGB A1C): HEMOGLOBIN A1C: 7

## 2015-04-23 MED ORDER — SILDENAFIL CITRATE 50 MG PO TABS: 50.0000 mg | ORAL_TABLET | Freq: Every day | ORAL | Status: DC | PRN

## 2015-04-23 MED ORDER — ENALAPRIL-HYDROCHLOROTHIAZIDE 5-12.5 MG PO TABS: 1.0000 | ORAL_TABLET | Freq: Every day | ORAL | Status: DC

## 2015-04-23 NOTE — Progress Notes (Signed)
  Subjective:    Patient ID: [name], male    DOB: 01/07/1951, 65 y.o.   MRN: [id number]  [name] is a 65 y.o. male who presents for follow-up of Type 2 diabetes mellitus.  Home blood sugar records: fasting range: low 100s Current symptoms/problems include none and have been unchanged. Daily foot checks:   Any foot concerns: none Exercise: routine ADLs Diet:regular He continues to smoke and is not ready to quit. He is retired and enjoying his retirement. He also has erectile dysfunction and is now ready to try medication. The following portions of the patient's history were reviewed and updated as appropriate: allergies, current medications, past medical history, past social history and problem list.  ROS as in subjective above.     Objective:    Physical Exam Alert and in no distress otherwise not examined.  Blood pressure 150/88, pulse 77, weight 162 lb 6 oz (73.653 kg), SpO2 99 %.  Lab Review Diabetic Labs Latest Ref Rng 08/10/2014 03/13/2014 11/10/2013 06/21/2013 02/14/2013  HbA1c - 6.8 7.0 - 7.4% 6.3  Chol 0 - 200 mg/dL - - 137 - -  HDL >39 mg/dL - - 40 - -  Calc LDL 0 - 99 mg/dL - - 85 - -  Triglycerides <150 mg/dL - - 61 - -  Creatinine 0.50 - 1.35 mg/dL - - 1.10 - -   BP/Weight 04/23/2015 12/21/2014 12/07/2014 08/10/2014 Q000111Q  Systolic BP Q000111Q A999333 - 123XX123 123456  Diastolic BP 88 79 - 70 70  Wt. (Lbs) 162.38 153 153.6 157 167  BMI 22.66 21.35 21.43 23.17 24.65   Foot/eye exam completion dates Latest Ref Rng 08/10/2014 11/10/2013  Eye Exam No Retinopathy No Retinopathy -  Foot Form Completion - - Done    Cable  reports that he has been smoking.  He has never used smokeless tobacco. He reports that he uses illicit drugs (Marijuana). He reports that he does not drink alcohol.     Assessment & Plan:    Controlled type 2 diabetes mellitus with complication, without long-term current use of insulin ([place]) - Plan: CBC with Differential/Platelet,  Comprehensive metabolic panel, Lipid panel, POCT UA - Microalbumin  ED (erectile dysfunction) of organic origin - Plan: sildenafil (VIAGRA) 50 MG tablet  Hyperlipidemia associated with type 2 diabetes mellitus ([place]) - Plan: Lipid panel  Hypertension associated with diabetes ([place]) - Plan: Enalapril-Hydrochlorothiazide 5-12.5 MG tablet, CBC with Differential/Platelet, Comprehensive metabolic panel  Current smoker on some days  Special screening for malignant neoplasms, colon  Need for hepatitis C screening test - Plan: Hepatitis C antibody   Rx changes:Enalapril/HCTZ  Education: Reviewed 'ABCs' of diabetes management (respective goals in parentheses):  A1C (<7), blood pressure (<130/80), and cholesterol (LDL <100).  Compliance at present is estimated to be good. Efforts to improve compliance (if necessary) will be directed at no change.  Follow up: one month he is also to bring his blood pressure cuff with him. Scuffed the use of Viagra as well as side effects. He will let me know if it works for him.

## 2015-04-23 NOTE — Addendum Note (Signed)
Addended by: Clarnce Flock E on: 04/23/2015 01:22 PM   Modules accepted: Orders

## 2015-04-24 LAB — COMPREHENSIVE METABOLIC PANEL
ALK PHOS: 53 U/L (ref 40–115)
ALT: 17 U/L (ref 9–46)
AST: 18 U/L (ref 10–35)
Albumin: 4.2 g/dL (ref 3.6–5.1)
BUN: 17 mg/dL (ref 7–25)
CO2: 21 mmol/L (ref 20–31)
Calcium: 9.6 mg/dL (ref 8.6–10.3)
Chloride: 104 mmol/L (ref 98–110)
Creat: 1.14 mg/dL (ref 0.70–1.25)
GLUCOSE: 150 mg/dL — AB (ref 65–99)
POTASSIUM: 4 mmol/L (ref 3.5–5.3)
Sodium: 137 mmol/L (ref 135–146)
Total Bilirubin: 0.5 mg/dL (ref 0.2–1.2)
Total Protein: 7.2 g/dL (ref 6.1–8.1)

## 2015-04-24 LAB — LIPID PANEL
Cholesterol: 122 mg/dL — ABNORMAL LOW (ref 125–200)
HDL: 35 mg/dL — ABNORMAL LOW (ref >=40)
LDL CALC: 62 mg/dL (ref <130)
Total CHOL/HDL Ratio: 3.5 Ratio (ref <=5.0)
Triglycerides: 126 mg/dL (ref <150)
VLDL: 25 mg/dL (ref <30)

## 2015-04-24 LAB — HEPATITIS C ANTIBODY: HCV AB: NEGATIVE

## 2015-06-16 ENCOUNTER — Other Ambulatory Visit: Payer: Self-pay | Admitting: Family Medicine

## 2015-08-29 ENCOUNTER — Encounter: Payer: Self-pay | Admitting: Family Medicine

## 2015-08-29 LAB — HM DIABETES EYE EXAM

## 2015-10-26 ENCOUNTER — Encounter: Payer: Self-pay | Admitting: Family Medicine

## 2015-10-26 ENCOUNTER — Ambulatory Visit (INDEPENDENT_AMBULATORY_CARE_PROVIDER_SITE_OTHER): Payer: BLUE CROSS/BLUE SHIELD | Admitting: Family Medicine

## 2015-10-26 VITALS — BP 126/80 | HR 70 | Ht 71.0 in | Wt 152.0 lb

## 2015-10-26 DIAGNOSIS — E785 Hyperlipidemia, unspecified: Secondary | ICD-10-CM | POA: Diagnosis not present

## 2015-10-26 DIAGNOSIS — E1169 Type 2 diabetes mellitus with other specified complication: Secondary | ICD-10-CM | POA: Diagnosis not present

## 2015-10-26 DIAGNOSIS — Z720 Tobacco use: Secondary | ICD-10-CM | POA: Diagnosis not present

## 2015-10-26 DIAGNOSIS — E118 Type 2 diabetes mellitus with unspecified complications: Secondary | ICD-10-CM | POA: Diagnosis not present

## 2015-10-26 DIAGNOSIS — I10 Essential (primary) hypertension: Secondary | ICD-10-CM | POA: Diagnosis not present

## 2015-10-26 DIAGNOSIS — Z23 Encounter for immunization: Secondary | ICD-10-CM | POA: Diagnosis not present

## 2015-10-26 DIAGNOSIS — F17200 Nicotine dependence, unspecified, uncomplicated: Secondary | ICD-10-CM

## 2015-10-26 DIAGNOSIS — E1159 Type 2 diabetes mellitus with other circulatory complications: Secondary | ICD-10-CM

## 2015-10-26 LAB — POCT GLYCOSYLATED HEMOGLOBIN (HGB A1C): Hemoglobin A1C: 7.1

## 2015-10-26 NOTE — Patient Instructions (Signed)
Check your sugars either before a meal or 2 hours after a meal and if 2 hours after meal they're above 180 then you need to look at is what going on

## 2015-10-26 NOTE — Progress Notes (Signed)
  Subjective:    Patient ID: [name], male    DOB: [birth year], 65 y.o.   MRN: [id number]  [name] is a 65 y.o. male who presents for follow-up of Type 2 diabetes mellitus.  Patient is checking home blood sugars.   Home blood sugar records: 150 to 180 He is checking them at random times How often is blood sugars being checked: Twice a week Current symptoms/problems none Daily foot checks:yes    Any foot concerns: none Last eye exam: 08/29/15 Exercise:  Bike,cut grass He continues on Invokana but states he does not need Viagra. He is having no difficulty with the enalapril for his blood pressure. He continues on atorvastatin and has no trouble with aches and pains or neurologic problems. The following portions of the patient's history were reviewed and updated as appropriate: allergies, current medications, past medical history, past social history and problem list.  ROS as in subjective above.     Objective:    Physical Exam Alert and in no distress otherwise not examined.   Lab Review Diabetic Labs Latest Ref Rng & Units 04/23/2015 08/10/2014 03/13/2014 11/10/2013 06/21/2013  HbA1c - 7.0 6.8 7.0 - 7.4%  Chol 125 - 200 mg/dL 122(L) - - 137 -  HDL >=40 mg/dL 35(L) - - 40 -  Calc LDL <130 mg/dL 62 - - 85 -  Triglycerides <150 mg/dL 126 - - 61 -  Creatinine 0.70 - 1.25 mg/dL 1.14 - - 1.10 -   BP/Weight 04/23/2015 12/21/2014 12/07/2014 08/10/2014 Q000111Q  Systolic BP Q000111Q A999333 - 123XX123 123456  Diastolic BP 88 79 - 70 70  Wt. (Lbs) 162.38 153 153.6 157 167  BMI 22.66 21.35 21.43 23.17 24.65   Foot/eye exam completion dates Latest Ref Rng & Units 08/29/2015 08/10/2014  Eye Exam No Retinopathy No Retinopathy No Retinopathy  Foot Form Completion - - -  Hemoglobin A1c is 7.1  [name]  reports that he has been smoking.  He has never used smokeless tobacco. He reports that he uses drugs, including Marijuana. He reports that he does not drink alcohol. He is still not interested in  quitting smoking    Assessment & Plan:    Controlled type 2 diabetes mellitus with complication, without long-term current use of insulin ([place]) - Plan: HgB A1c  Hypertension associated with diabetes ([place])  Hyperlipidemia associated with type 2 diabetes mellitus ([place])  Current smoker on some days  Need for prophylactic vaccination and inoculation against influenza - Plan: Flu Vaccine QUAD 36+ mos IM    1. Rx changes: none 2. Education: Reviewed 'ABCs' of diabetes management (respective goals in parentheses):  A1C (<7), blood pressure (<130/80), and cholesterol (LDL <100). 3. Compliance at present is estimated to be good. Efforts to improve compliance (if necessary) will be directed at No change. 4. Follow up: 4 months Flu shot given today.

## 2015-12-22 ENCOUNTER — Other Ambulatory Visit: Payer: Self-pay | Admitting: Family Medicine

## 2016-02-28 ENCOUNTER — Encounter: Payer: Self-pay | Admitting: Family Medicine

## 2016-02-28 ENCOUNTER — Ambulatory Visit (INDEPENDENT_AMBULATORY_CARE_PROVIDER_SITE_OTHER): Payer: BLUE CROSS/BLUE SHIELD | Admitting: Family Medicine

## 2016-02-28 VITALS — BP 120/72 | HR 76 | Ht 71.0 in | Wt 163.0 lb

## 2016-02-28 DIAGNOSIS — N529 Male erectile dysfunction, unspecified: Secondary | ICD-10-CM

## 2016-02-28 DIAGNOSIS — E1169 Type 2 diabetes mellitus with other specified complication: Secondary | ICD-10-CM | POA: Diagnosis not present

## 2016-02-28 DIAGNOSIS — E1159 Type 2 diabetes mellitus with other circulatory complications: Secondary | ICD-10-CM

## 2016-02-28 DIAGNOSIS — E785 Hyperlipidemia, unspecified: Secondary | ICD-10-CM | POA: Diagnosis not present

## 2016-02-28 DIAGNOSIS — E118 Type 2 diabetes mellitus with unspecified complications: Secondary | ICD-10-CM | POA: Diagnosis not present

## 2016-02-28 DIAGNOSIS — F17200 Nicotine dependence, unspecified, uncomplicated: Secondary | ICD-10-CM | POA: Diagnosis not present

## 2016-02-28 DIAGNOSIS — I10 Essential (primary) hypertension: Secondary | ICD-10-CM

## 2016-02-28 LAB — POCT GLYCOSYLATED HEMOGLOBIN (HGB A1C): Hemoglobin A1C: 7.5

## 2016-02-28 NOTE — Progress Notes (Signed)
  Subjective:    Patient ID: [name], male    DOB: 22-Apr-1950, 66 y.o.   MRN: [id number]  [name] is a 66 y.o. male who presents for follow-up of Type 2 diabetes mellitus.  Patient is checking home blood sugars.   Home blood sugar records: 100 to 170 How often is blood sugars being checked:  Once a week Current symptoms/problems none Daily foot checks: yes   Any foot concerns: none Last eye exam: 08/29/2015 Exercise: all the time  He continues on Lipitor as well as enalapril/HCTZ. He also is taking Onglyza. Not having any difficulty within these medications. He continues to use marijuana intermittently. He states that the marijuana helps with his ED The following portions of the patient's history were reviewed and updated as appropriate: allergies, current medications, past medical history, past social history and problem list.  ROS as in subjective above.     Objective:    Physical Exam Alert and in no distress otherwise not examined.  Lab Review Diabetic Labs Latest Ref Rng & Units 02/28/2016 10/26/2015 04/23/2015 08/10/2014 03/13/2014  HbA1c - 7.5 7.1 7.0 6.8 7.0  Microalbumin mg/dL - - - - -  Micro/Creat Ratio - - - - - -  Chol 125 - 200 mg/dL - - 122(L) - -  HDL >=40 mg/dL - - 35(L) - -  Calc LDL <130 mg/dL - - 62 - -  Triglycerides <150 mg/dL - - 126 - -  Creatinine 0.70 - 1.25 mg/dL - - 1.14 - -   BP/Weight 02/28/2016 10/26/2015 04/23/2015 12/21/2014 123XX123  Systolic BP 123456 123XX123 Q000111Q A999333 -  Diastolic BP 72 80 88 79 -  Wt. (Lbs) 163 152 162.38 153 153.6  BMI 22.73 21.2 22.66 21.35 21.43   Foot/eye exam completion dates Latest Ref Rng & Units 08/29/2015 08/10/2014  Eye Exam No Retinopathy No Retinopathy No Retinopathy  Foot Form Completion - - -  A1c is 7.5  Random  reports that he has been smoking.  He has never used smokeless tobacco. He reports that he uses drugs, including Marijuana. He reports that he does not drink alcohol.     Assessment & Plan:      Controlled type 2 diabetes mellitus with complication, without long-term current use of insulin ([place]) - Plan: HgB A1c  Hypertension associated with diabetes ([place])  Hyperlipidemia associated with type 2 diabetes mellitus ([place])  Current smoker on some days  ED (erectile dysfunction) of organic origin   Rx changes: none  Education: Reviewed 'ABCs' of diabetes management (respective goals in parentheses):  A1C (<7), blood pressure (<130/80), and cholesterol (LDL <100).  Compliance at present is estimated to be good. Efforts to improve compliance (if necessary) will be directed at No change.  Follow up: 4 months Discussed the fact that his A1c is going up slightly and at some point, we might need to add more medication to his regimen in spite of him doing a good job of taking care of himself.

## 2016-06-17 ENCOUNTER — Telehealth: Payer: Self-pay | Admitting: Family Medicine

## 2016-06-17 NOTE — Telephone Encounter (Signed)
90 day rx request from [facility]   90 day supply

## 2016-06-17 NOTE — Telephone Encounter (Signed)
Refill request

## 2016-06-18 MED ORDER — SAXAGLIPTIN HCL 5 MG PO TABS: 5.0000 mg | ORAL_TABLET | Freq: Every day | ORAL | 5 refills | Status: DC

## 2016-06-27 ENCOUNTER — Other Ambulatory Visit: Payer: Self-pay | Admitting: Family Medicine

## 2016-06-30 ENCOUNTER — Ambulatory Visit: Payer: BLUE CROSS/BLUE SHIELD | Admitting: Family Medicine

## 2016-07-02 ENCOUNTER — Encounter: Payer: Self-pay | Admitting: Family Medicine

## 2016-07-25 ENCOUNTER — Ambulatory Visit (INDEPENDENT_AMBULATORY_CARE_PROVIDER_SITE_OTHER): Payer: BLUE CROSS/BLUE SHIELD | Admitting: Family Medicine

## 2016-07-25 ENCOUNTER — Encounter: Payer: Self-pay | Admitting: Family Medicine

## 2016-07-25 VITALS — BP 120/80 | HR 78 | Ht 71.0 in | Wt 155.0 lb

## 2016-07-25 DIAGNOSIS — E118 Type 2 diabetes mellitus with unspecified complications: Secondary | ICD-10-CM | POA: Diagnosis not present

## 2016-07-25 DIAGNOSIS — I10 Essential (primary) hypertension: Secondary | ICD-10-CM | POA: Diagnosis not present

## 2016-07-25 DIAGNOSIS — E1159 Type 2 diabetes mellitus with other circulatory complications: Secondary | ICD-10-CM | POA: Diagnosis not present

## 2016-07-25 DIAGNOSIS — E1169 Type 2 diabetes mellitus with other specified complication: Secondary | ICD-10-CM

## 2016-07-25 DIAGNOSIS — M67431 Ganglion, right wrist: Secondary | ICD-10-CM | POA: Diagnosis not present

## 2016-07-25 DIAGNOSIS — Z23 Encounter for immunization: Secondary | ICD-10-CM

## 2016-07-25 DIAGNOSIS — E785 Hyperlipidemia, unspecified: Secondary | ICD-10-CM

## 2016-07-25 LAB — POCT UA - MICROALBUMIN
ALBUMIN/CREATININE RATIO, URINE, POC: 13.4
Creatinine, POC: 261.1 mg/dL
MICROALBUMIN (UR) POC: 35 mg/L

## 2016-07-25 LAB — POCT GLYCOSYLATED HEMOGLOBIN (HGB A1C): HEMOGLOBIN A1C: 7.6

## 2016-07-25 NOTE — Patient Instructions (Signed)
Ganglion Cyst A ganglion cyst is a noncancerous, fluid-filled lump that occurs near joints or tendons. The ganglion cyst grows out of a joint or the lining of a tendon. It most often develops in the hand or wrist, but it can also develop in the shoulder, elbow, hip, knee, ankle, or foot. The round or oval ganglion cyst can be the size of a pea or larger than a grape. Increased activity may enlarge the size of the cyst because more fluid starts to build up. What are the causes? It is not known what causes a ganglion cyst to grow. However, it may be related to:  Inflammation or irritation around the joint.  An injury.  Repetitive movements or overuse.  Arthritis.  What increases the risk? Risk factors include:  Being a woman.  Being age 20-50.  What are the signs or symptoms? Symptoms may include:  A lump. This most often appears on the hand or wrist, but it can occur in other areas of the body.  Tingling.  Pain.  Numbness.  Muscle weakness.  Weak grip.  Less movement in a joint.  How is this diagnosed? Ganglion cysts are most often diagnosed based on a physical exam. Your health care provider will feel the lump and may shine a light alongside it. If it is a ganglion cyst, a light often shines through it. Your health care provider may order an X-ray, ultrasound, or MRI to rule out other conditions. How is this treated? Ganglion cysts usually go away on their own without treatment. If pain or other symptoms are involved, treatment may be needed. Treatment is also needed if the ganglion cyst limits your movement or if it gets infected. Treatment may include:  Wearing a brace or splint on your wrist or finger.  Taking anti-inflammatory medicine.  Draining fluid from the lump with a needle (aspiration).  Injecting a steroid into the joint.  Surgery to remove the ganglion cyst.  Follow these instructions at home:  Do not press on the ganglion cyst, poke it with a  needle, or hit it.  Take medicines only as directed by your health care provider.  Wear your brace or splint as directed by your health care provider.  Watch your ganglion cyst for any changes.  Keep all follow-up visits as directed by your health care provider. This is important. Contact a health care provider if:  Your ganglion cyst becomes larger or more painful.  You have increased redness, red streaks, or swelling.  You have pus coming from the lump.  You have weakness or numbness in the affected area.  You have a fever or chills. This information is not intended to replace advice given to you by your health care provider. Make sure you discuss any questions you have with your health care provider. Document Released: 02/08/2000 Document Revised: 07/19/2015 Document Reviewed: 07/26/2013 Elsevier Interactive Patient Education  2018 [employer]  

## 2016-07-25 NOTE — Progress Notes (Signed)
  Subjective:    Patient ID: [name], male    DOB: January 21, 1951, 66 y.o.   MRN: [id number]  [name] is a 66 y.o. male who presents for follow-up of Type 2 diabetes mellitus.  Patient is checking home blood sugars.   Home blood sugar records: 120 to 160 How often is blood sugars being checked: once a week Current symptoms/problems none Daily foot checks: yes  Any foot concerns: none Last eye exam: 6/17 has appointment 7/18 Exercise:  Rides bike and mows yards He continues on his Onglyza and having no difficulty with that. He also is taking enalapril/HCTZ and asymptomatic from that as well as from his atorvastatin. He does have a lesion present on his right wrist that is not bothering him but he has concerns about it. The following portions of the patient's history were reviewed and updated as appropriate: allergies, current medications, past medical history, past social history and problem list.  ROS as in subjective above.     Objective:    Physical Exam Alert and in no distress A round smooth one and half centimeter easily movable lesion is noted on the dorsal surface of his right wrist.  Blood pressure 120/80, pulse 78, height 5\' 11"  (1.803 m), weight 155 lb (70.3 kg), SpO2 98 %.  Lab Review Diabetic Labs Latest Ref Rng & Units 07/25/2016 02/28/2016 10/26/2015 04/23/2015 08/10/2014  HbA1c - 7.6 7.5 7.1 7.0 6.8  Microalbumin mg/L 35.0 - - - -  Micro/Creat Ratio - 13.4 - - - -  Chol 125 - 200 mg/dL - - - 122(L) -  HDL >=40 mg/dL - - - 35(L) -  Calc LDL <130 mg/dL - - - 62 -  Triglycerides <150 mg/dL - - - 126 -  Creatinine 0.70 - 1.25 mg/dL - - - 1.14 -   BP/Weight 07/25/2016 02/28/2016 10/26/2015 04/23/2015 82/50/0370  Systolic BP 488 891 694 503 888  Diastolic BP 80 72 80 88 79  Wt. (Lbs) 155 163 152 162.38 153  BMI 21.62 22.73 21.2 22.66 21.35   Foot/eye exam completion dates Latest Ref Rng & Units 08/29/2015 08/10/2014  Eye Exam No Retinopathy No Retinopathy No  Retinopathy  Foot Form Completion - - -  A1c is 7.6  [name]  reports that he quit smoking about 36 years ago. He has never used smokeless tobacco. He reports that he uses drugs, including Marijuana. He reports that he does not drink alcohol.     Assessment & Plan:    Controlled type 2 diabetes mellitus with complication, without long-term current use of insulin ([place]) - Plan: POCT UA - Microalbumin, HgB A1c  Ganglion cyst of dorsum of right wrist  Need for Tdap vaccination - Plan: Tdap vaccine greater than or equal to 7yo IM  Need for shingles vaccine - Plan: Varicella-zoster vaccine IM (Shingrix)  Hypertension associated with diabetes ([place])  Hyperlipidemia associated with type 2 diabetes mellitus ([place])  1. Rx changes: none 2. Education: Reviewed 'ABCs' of diabetes management (respective goals in parentheses):  A1C (<7), blood pressure (<130/80), and cholesterol (LDL <100). 3. Compliance at present is estimated to be good. Efforts to improve compliance (if necessary) will be directed at No change in his present regimen. 4. Follow up: 4 months  I explained that he has a ganglion and nothing needs to be done unless it starts to give him difficulty. He was comfortable with that.

## 2016-07-27 ENCOUNTER — Other Ambulatory Visit: Payer: Self-pay | Admitting: Family Medicine

## 2016-07-27 DIAGNOSIS — I152 Hypertension secondary to endocrine disorders: Secondary | ICD-10-CM

## 2016-07-27 DIAGNOSIS — I10 Essential (primary) hypertension: Principal | ICD-10-CM

## 2016-07-27 DIAGNOSIS — E1159 Type 2 diabetes mellitus with other circulatory complications: Secondary | ICD-10-CM

## 2016-08-11 ENCOUNTER — Other Ambulatory Visit: Payer: Self-pay | Admitting: Family Medicine

## 2016-08-11 DIAGNOSIS — E1159 Type 2 diabetes mellitus with other circulatory complications: Secondary | ICD-10-CM

## 2016-08-11 DIAGNOSIS — I10 Essential (primary) hypertension: Principal | ICD-10-CM

## 2016-08-11 DIAGNOSIS — I152 Hypertension secondary to endocrine disorders: Secondary | ICD-10-CM

## 2016-09-03 LAB — HM DIABETES EYE EXAM

## 2016-09-09 ENCOUNTER — Encounter: Payer: Self-pay | Admitting: Family Medicine

## 2016-11-06 ENCOUNTER — Encounter: Payer: Self-pay | Admitting: Family Medicine

## 2016-11-06 ENCOUNTER — Ambulatory Visit (INDEPENDENT_AMBULATORY_CARE_PROVIDER_SITE_OTHER): Payer: BLUE CROSS/BLUE SHIELD | Admitting: Family Medicine

## 2016-11-06 ENCOUNTER — Telehealth: Payer: Self-pay | Admitting: Medical

## 2016-11-06 VITALS — BP 110/50 | HR 74 | Resp 16 | Wt 153.4 lb

## 2016-11-06 DIAGNOSIS — I10 Essential (primary) hypertension: Secondary | ICD-10-CM

## 2016-11-06 DIAGNOSIS — Z23 Encounter for immunization: Secondary | ICD-10-CM

## 2016-11-06 DIAGNOSIS — E118 Type 2 diabetes mellitus with unspecified complications: Secondary | ICD-10-CM

## 2016-11-06 DIAGNOSIS — Z87891 Personal history of nicotine dependence: Secondary | ICD-10-CM | POA: Diagnosis not present

## 2016-11-06 DIAGNOSIS — E1169 Type 2 diabetes mellitus with other specified complication: Secondary | ICD-10-CM

## 2016-11-06 DIAGNOSIS — E785 Hyperlipidemia, unspecified: Secondary | ICD-10-CM

## 2016-11-06 DIAGNOSIS — E1159 Type 2 diabetes mellitus with other circulatory complications: Secondary | ICD-10-CM

## 2016-11-06 LAB — POCT GLYCOSYLATED HEMOGLOBIN (HGB A1C): Hemoglobin A1C: 7.5

## 2016-11-06 MED ORDER — SAXAGLIPTIN-METFORMIN ER 5-500 MG PO TB24: 1.0000 | ORAL_TABLET | Freq: Every day | ORAL | 5 refills | Status: DC

## 2016-11-06 NOTE — Patient Instructions (Addendum)
Finish the Onglyza then start on the [place] and call me if you have any trouble,

## 2016-11-06 NOTE — Progress Notes (Signed)
  Subjective:    Patient ID: [name], male    DOB: [birth year], 66 y.o.   MRN: [id number]  [name] is a 66 y.o. male who presents for follow-up of Type 2 diabetes mellitus.  Patient is checking home blood sugars.   Home blood sugar records: range 110- 115.  How often is blood sugars being checked: bid Current symptoms/problems include none and have been unchanged. Daily foot checks: yes   Any foot concerns: none Last eye exam: 08/2016 Exercise: Home exercise routine includes riding bike and running. He continues on his Lipitor and is having no difficulty with that. He is also taking his blood pressure medication and having no trouble. ONGLYZA is working well for him. He has apparently not ever been placed on metformin however the record is not clear concerning that. He does not smoke cigarettes but does smoke marijuana and states he does this to help with his eating. The following portions of the patient's history were reviewed and updated as appropriate: allergies, current medications, past medical history, past social history and problem list.  ROS as in subjective above.     Objective:    Physical Exam Alert and in no distress otherwise not examined.   Lab Review Diabetic Labs Latest Ref Rng & Units 07/25/2016 02/28/2016 10/26/2015 04/23/2015 08/10/2014  HbA1c - 7.6 7.5 7.1 7.0 6.8  Microalbumin mg/L 35.0 - - - -  Micro/Creat Ratio - 13.4 - - - -  Chol 125 - 200 mg/dL - - - 122(L) -  HDL >=40 mg/dL - - - 35(L) -  Calc LDL <130 mg/dL - - - 62 -  Triglycerides <150 mg/dL - - - 126 -  Creatinine 0.70 - 1.25 mg/dL - - - 1.14 -   BP/Weight 07/25/2016 02/28/2016 10/26/2015 04/23/2015 25/36/6440  Systolic BP 347 425 956 387 564  Diastolic BP 80 72 80 88 79  Wt. (Lbs) 155 163 152 162.38 153  BMI 21.62 22.73 21.2 22.66 21.35   Foot/eye exam completion dates Latest Ref Rng & Units 09/03/2016 08/29/2015  Eye Exam No Retinopathy No Retinopathy No Retinopathy  Foot Form Completion  - - -   Foot exam normal. A1c 7.5 [name]  reports that he quit smoking about 36 years ago. He has never used smokeless tobacco. He reports that he uses drugs, including Marijuana. He reports that he does not drink alcohol.     Assessment & Plan:    1. Rx changes: I will switch him to [place]. 2. Education: Reviewed 'ABCs' of diabetes management (respective goals in parentheses):  A1C (<7), blood pressure (<130/80), and cholesterol (LDL <100). 3. Compliance at present is estimated to be fair. Efforts to improve compliance (if necessary) will be directed at No change. 4. Follow up: 4 months He will finish out his regular dosing of ONGLYZA. Samples of Kombiglyze given. As long as he has no difficulty he will then get the prescription filled and follow-up here in several months. Although his A1c isn't bad, I would like to still get him down to below an A1c of 7.

## 2016-11-06 NOTE — Telephone Encounter (Signed)
Wife called that new medication Kombiglyze [phone] for 30 days at pharmacy.  Activated & printed discount card.  Called pharmacy & had them run [place] only and used discount card and changed to 90 days because pt's insurance is changing at end of month to Medicare.  So pt getting 90 days for $0 co pay.

## 2016-11-07 LAB — CBC WITH DIFFERENTIAL/PLATELET
BASOS ABS: 29 {cells}/uL (ref 0–200)
Basophils Relative: 0.3 %
Eosinophils Absolute: 432 cells/uL (ref 15–500)
Eosinophils Relative: 4.5 %
HEMATOCRIT: 42.8 % (ref 38.5–50.0)
HEMOGLOBIN: 14.4 g/dL (ref 13.2–17.1)
Lymphs Abs: 1872 cells/uL (ref 850–3900)
MCH: 29.1 pg (ref 27.0–33.0)
MCHC: 33.6 g/dL (ref 32.0–36.0)
MCV: 86.5 fL (ref 80.0–100.0)
MONOS PCT: 9 %
MPV: 10.6 fL (ref 7.5–12.5)
NEUTROS ABS: 6403 {cells}/uL (ref 1500–7800)
Neutrophils Relative %: 66.7 %
Platelets: 240 10*3/uL (ref 140–400)
RBC: 4.95 10*6/uL (ref 4.20–5.80)
RDW: 14.2 % (ref 11.0–15.0)
Total Lymphocyte: 19.5 %
WBC mixed population: 864 cells/uL (ref 200–950)
WBC: 9.6 10*3/uL (ref 3.8–10.8)

## 2016-11-07 LAB — COMPREHENSIVE METABOLIC PANEL
AG RATIO: 1.5 (calc) (ref 1.0–2.5)
ALT: 16 U/L (ref 9–46)
AST: 16 U/L (ref 10–35)
Albumin: 4 g/dL (ref 3.6–5.1)
Alkaline phosphatase (APISO): 55 U/L (ref 40–115)
BILIRUBIN TOTAL: 0.5 mg/dL (ref 0.2–1.2)
BUN / CREAT RATIO: 19 (calc) (ref 6–22)
BUN: 24 mg/dL (ref 7–25)
CHLORIDE: 105 mmol/L (ref 98–110)
CO2: 25 mmol/L (ref 20–32)
Calcium: 9.5 mg/dL (ref 8.6–10.3)
Creat: 1.29 mg/dL — ABNORMAL HIGH (ref 0.70–1.25)
GLOBULIN: 2.7 g/dL (ref 1.9–3.7)
Glucose, Bld: 154 mg/dL — ABNORMAL HIGH (ref 65–99)
Potassium: 4.5 mmol/L (ref 3.5–5.3)
Sodium: 136 mmol/L (ref 135–146)
Total Protein: 6.7 g/dL (ref 6.1–8.1)

## 2016-11-07 LAB — LIPID PANEL
CHOLESTEROL: 119 mg/dL (ref <200)
HDL: 33 mg/dL — AB (ref >40)
LDL Cholesterol (Calc): 66 mg/dL (calc)
Non-HDL Cholesterol (Calc): 86 mg/dL (calc) (ref <130)
TRIGLYCERIDES: 123 mg/dL (ref <150)
Total CHOL/HDL Ratio: 3.6 (calc) (ref <5.0)

## 2016-11-10 ENCOUNTER — Telehealth: Payer: Self-pay | Admitting: Family Medicine

## 2016-11-10 NOTE — Telephone Encounter (Signed)
Pt wife called advised of discount card notes and 0 copay

## 2017-03-04 ENCOUNTER — Telehealth: Payer: Self-pay | Admitting: Family Medicine

## 2017-03-04 MED ORDER — ATORVASTATIN CALCIUM 40 MG PO TABS: 40.0000 mg | ORAL_TABLET | Freq: Every day | ORAL | 0 refills | Status: DC

## 2017-03-04 NOTE — Telephone Encounter (Signed)
DONE

## 2017-03-04 NOTE — Telephone Encounter (Signed)
Rcvd refill request for Atorvastatin 40 mg #90

## 2017-03-11 ENCOUNTER — Ambulatory Visit: Payer: BLUE CROSS/BLUE SHIELD | Admitting: Family Medicine

## 2017-03-13 ENCOUNTER — Ambulatory Visit (INDEPENDENT_AMBULATORY_CARE_PROVIDER_SITE_OTHER): Payer: Medicare Other | Admitting: Family Medicine

## 2017-03-13 ENCOUNTER — Encounter: Payer: Self-pay | Admitting: Family Medicine

## 2017-03-13 VITALS — BP 134/78 | HR 78 | Wt 157.0 lb

## 2017-03-13 DIAGNOSIS — I10 Essential (primary) hypertension: Secondary | ICD-10-CM

## 2017-03-13 DIAGNOSIS — E785 Hyperlipidemia, unspecified: Secondary | ICD-10-CM

## 2017-03-13 DIAGNOSIS — Z136 Encounter for screening for cardiovascular disorders: Secondary | ICD-10-CM

## 2017-03-13 DIAGNOSIS — E1169 Type 2 diabetes mellitus with other specified complication: Secondary | ICD-10-CM | POA: Diagnosis not present

## 2017-03-13 DIAGNOSIS — E118 Type 2 diabetes mellitus with unspecified complications: Secondary | ICD-10-CM | POA: Diagnosis not present

## 2017-03-13 DIAGNOSIS — Z87891 Personal history of nicotine dependence: Secondary | ICD-10-CM

## 2017-03-13 DIAGNOSIS — E1159 Type 2 diabetes mellitus with other circulatory complications: Secondary | ICD-10-CM | POA: Diagnosis not present

## 2017-03-13 LAB — POCT GLYCOSYLATED HEMOGLOBIN (HGB A1C): Hemoglobin A1C: 7.7

## 2017-03-13 NOTE — Addendum Note (Signed)
Addended by: Elyse Jarvis on: 03/13/2017 11:22 AM   Modules accepted: Orders

## 2017-03-13 NOTE — Progress Notes (Signed)
  Subjective:    Patient ID: [name], male    DOB: [birth year], 67 y.o.   MRN: [id number]  [name] is a 67 y.o. male who presents for follow-up of Type 2 diabetes mellitus.  Patient is checking home blood sugars.   Home blood sugar records:120-200 How often is blood sugars being checked: QOD Current symptoms/problems include none and have been unchanged. Daily foot checks: yes   Any foot concerns: no Last eye exam: may 2018 Exercise: walk everyday for about a hour He continues on atorvastatin and is having no difficulty with that.  He is also taking his Enalapril/hydrochlorothiazide without difficulty.  Continues on his Kombiglyze. The following portions of the patient's history were reviewed and updated as appropriate: allergies, current medications, past medical history, past social history and problem list.  ROS as in subjective above.     Objective:    Physical Exam Alert and in no distress otherwise not examined.   Lab Review Diabetic Labs Latest Ref Rng & Units 11/06/2016 07/25/2016 02/28/2016 10/26/2015 04/23/2015  HbA1c - 7.5 7.6 7.5 7.1 7.0  Microalbumin mg/L - 35.0 - - -  Micro/Creat Ratio - - 13.4 - - -  Chol <200 mg/dL 119 - - - 122(L)  HDL >40 mg/dL 33(L) - - - 35(L)  Calc LDL <130 mg/dL - - - - 62  Triglycerides <150 mg/dL 123 - - - 126  Creatinine 0.70 - 1.25 mg/dL 1.29(H) - - - 1.14   BP/Weight 11/06/2016 07/25/2016 02/28/2016 10/26/2015 1/89/8421  Systolic BP 031 281 188 677 373  Diastolic BP 50 80 72 80 88  Wt. (Lbs) 153.4 155 163 152 162.38  BMI 21.39 21.62 22.73 21.2 22.66   Foot/eye exam completion dates Latest Ref Rng & Units 11/06/2016 09/03/2016  Eye Exam No Retinopathy - No Retinopathy  Foot Form Completion - Done -   A1c is 7.7 [name]  reports that he quit smoking about 36 years ago. he has never used smokeless tobacco. He reports that he uses drugs. Drug: Marijuana. He reports that he does not drink alcohol.     Assessment & Plan:      Controlled type 2 diabetes mellitus with complication, without long-term current use of insulin ([facility])  Hyperlipidemia associated with type 2 diabetes mellitus ([place])  Hypertension associated with diabetes ([place])  Former smoker, stopped smoking in distant past  Screening for AAA (abdominal aortic aneurysm) - Plan: US Aorta Initial Medicare Screen   1. Rx changes: none 2. Education: Reviewed 'ABCs' of diabetes management (respective goals in parentheses):  A1C (<7), blood pressure (<130/80), and cholesterol (LDL <100). 3. Compliance at present is estimated to be good. 4. Follow up: 4 months

## 2017-03-31 ENCOUNTER — Ambulatory Visit
Admission: RE | Admit: 2017-03-31 | Discharge: 2017-03-31 | Disposition: A | Discharge status: Home / Self Care | Payer: Medicare Other | Source: Ambulatory Visit | Attending: Family Medicine | Admitting: Family Medicine

## 2017-03-31 DIAGNOSIS — Z136 Encounter for screening for cardiovascular disorders: Secondary | ICD-10-CM | POA: Diagnosis not present

## 2017-03-31 DIAGNOSIS — Z87891 Personal history of nicotine dependence: Secondary | ICD-10-CM | POA: Diagnosis not present

## 2017-04-01 ENCOUNTER — Encounter: Payer: Self-pay | Admitting: Internal Medicine

## 2017-05-02 ENCOUNTER — Other Ambulatory Visit: Payer: Self-pay | Admitting: Family Medicine

## 2017-05-02 DIAGNOSIS — E118 Type 2 diabetes mellitus with unspecified complications: Secondary | ICD-10-CM

## 2017-05-06 ENCOUNTER — Telehealth: Payer: Self-pay | Admitting: Family Medicine

## 2017-05-06 NOTE — Telephone Encounter (Signed)
Wife called and states that now since he is on Medicare his discount card for Kombiglyze is no longer working.  And cost is $220 for 90 days and $140 for 30 days.  I advised discount cards will not work for pt's with Medicare or any government insurance. I told her to call Medicare and see if there are any preferred meds that would be less expensive for pt.  She will call and call back.  He is not out of his meds at this time.

## 2017-05-15 NOTE — Telephone Encounter (Signed)
Wife called back and states she called Medicare and the only affordable options for Tier 1 are Metformin and Onglyza.  They can't afford the Kombiglyze and wants to know if he can be switched to Metformin and Onglyza?

## 2017-05-16 MED ORDER — METFORMIN HCL 500 MG PO TABS: 500.0000 mg | ORAL_TABLET | Freq: Two times a day (BID) | ORAL | 1 refills | Status: DC

## 2017-05-16 MED ORDER — SAXAGLIPTIN HCL 5 MG PO TABS: 5.0000 mg | ORAL_TABLET | Freq: Every day | ORAL | 1 refills | Status: DC

## 2017-05-16 NOTE — Telephone Encounter (Signed)
I called in the metformin and Onglyza

## 2017-05-18 NOTE — Telephone Encounter (Signed)
Pt was called to let know about new med being called in. Thanks [employer]

## 2017-05-19 ENCOUNTER — Telehealth: Payer: Self-pay | Admitting: Family Medicine

## 2017-05-19 MED ORDER — LINAGLIPTIN-METFORMIN HCL ER 5-1000 MG PO TB24: 1.0000 | ORAL_TABLET | Freq: Every day | ORAL | 5 refills | Status: DC

## 2017-05-19 NOTE — Telephone Encounter (Signed)
Wife said she will call office back because she is unclear on how pt would get med. [place]

## 2017-05-19 NOTE — Telephone Encounter (Signed)
Wife called and states that they can get Jentadueto XR thru [employer] for $40 a month and wanted to know if you could switch pt to that instead of the metformin and Onglyza because it would be cheaper and it would only be one pill? Please advise and send chart back to me Thanks

## 2017-05-19 NOTE — Telephone Encounter (Signed)
Send them a note that I did switch.

## 2017-05-19 NOTE — Telephone Encounter (Signed)
He will be switched to [facility] due to better insurance coverage.

## 2017-05-20 NOTE — Telephone Encounter (Signed)
Called pt wife to follow up on med and the assistant program. No answer and lvm. [place]

## 2017-05-21 NOTE — Telephone Encounter (Signed)
Called to follow up with med for pt. No answer LVM . [place]

## 2017-05-22 ENCOUNTER — Telehealth: Payer: Self-pay | Admitting: Family Medicine

## 2017-05-22 NOTE — Telephone Encounter (Signed)
Form should be here Tuesday of next week for new diabetes med. [facility] 05-22-17

## 2017-05-22 NOTE — Telephone Encounter (Signed)
New Message  Pts wife verbalized she lvm for Kim to give her a CB and called this morning wanting to speak to [facility].  Please f/u with pts wife

## 2017-05-22 NOTE — Telephone Encounter (Signed)
Still waiting on forms will call wife again. [place]

## 2017-05-26 ENCOUNTER — Other Ambulatory Visit: Payer: Self-pay | Admitting: Family Medicine

## 2017-06-03 ENCOUNTER — Telehealth: Payer: Self-pay | Admitting: Family Medicine

## 2017-06-03 NOTE — Telephone Encounter (Addendum)
Pt Assistance application completed and mailed

## 2017-06-03 NOTE — Telephone Encounter (Signed)
Recv'd Pt Assistance for Jentadueto, need written Rx

## 2017-06-18 NOTE — Telephone Encounter (Signed)
Samples Jentadueto XR 06/998 recv'd and given to pt to hold until Pt Assistance

## 2017-06-24 ENCOUNTER — Telehealth: Payer: Self-pay | Admitting: Family Medicine

## 2017-06-24 NOTE — Telephone Encounter (Signed)
Pt was advised. [name] 06-24-17

## 2017-06-24 NOTE — Telephone Encounter (Signed)
Wife called and states pt having problems with sleeping every night.  Wants to know what he can take that would be safe with the meds he is on?

## 2017-06-24 NOTE — Telephone Encounter (Signed)
He can take a short course of Sominex.  3 or 4 days at the max or on an as-needed basis but not more than half the time.

## 2017-07-06 NOTE — Telephone Encounter (Signed)
Wife called back and states pt finally got first shipment of Jentadueto

## 2017-08-05 ENCOUNTER — Encounter: Payer: Self-pay | Admitting: Family Medicine

## 2017-08-05 ENCOUNTER — Ambulatory Visit (INDEPENDENT_AMBULATORY_CARE_PROVIDER_SITE_OTHER): Payer: Medicare Other | Admitting: Family Medicine

## 2017-08-05 VITALS — BP 122/68 | HR 73 | Temp 98.0°F | Wt 150.0 lb

## 2017-08-05 DIAGNOSIS — I152 Hypertension secondary to endocrine disorders: Secondary | ICD-10-CM

## 2017-08-05 DIAGNOSIS — Z87891 Personal history of nicotine dependence: Secondary | ICD-10-CM | POA: Diagnosis not present

## 2017-08-05 DIAGNOSIS — I10 Essential (primary) hypertension: Secondary | ICD-10-CM

## 2017-08-05 DIAGNOSIS — D126 Benign neoplasm of colon, unspecified: Secondary | ICD-10-CM

## 2017-08-05 DIAGNOSIS — R195 Other fecal abnormalities: Secondary | ICD-10-CM | POA: Diagnosis not present

## 2017-08-05 DIAGNOSIS — E118 Type 2 diabetes mellitus with unspecified complications: Secondary | ICD-10-CM

## 2017-08-05 DIAGNOSIS — N410 Acute prostatitis: Secondary | ICD-10-CM

## 2017-08-05 DIAGNOSIS — E1169 Type 2 diabetes mellitus with other specified complication: Secondary | ICD-10-CM | POA: Diagnosis not present

## 2017-08-05 DIAGNOSIS — E1159 Type 2 diabetes mellitus with other circulatory complications: Secondary | ICD-10-CM | POA: Diagnosis not present

## 2017-08-05 DIAGNOSIS — E785 Hyperlipidemia, unspecified: Secondary | ICD-10-CM | POA: Diagnosis not present

## 2017-08-05 LAB — HEMOCCULT GUIAC POC 1CARD (OFFICE)

## 2017-08-05 LAB — POCT URINALYSIS DIP (PROADVANTAGE DEVICE)
BILIRUBIN UA: NEGATIVE
BILIRUBIN UA: NEGATIVE mg/dL
Glucose, UA: NEGATIVE mg/dL
Nitrite, UA: NEGATIVE
PH UA: 6 (ref 5.0–8.0)
Specific Gravity, Urine: 1.025
Urobilinogen, Ur: 3.5

## 2017-08-05 LAB — POCT GLYCOSYLATED HEMOGLOBIN (HGB A1C): HEMOGLOBIN A1C: 7.1 % — AB (ref 4.0–5.6)

## 2017-08-05 MED ORDER — SULFAMETHOXAZOLE-TRIMETHOPRIM 800-160 MG PO TABS: 1.0000 | ORAL_TABLET | Freq: Two times a day (BID) | ORAL | 0 refills | Status: DC

## 2017-08-05 NOTE — Progress Notes (Signed)
  Subjective:    Patient ID: [name], male    DOB: 1950-12-03, 67 y.o.   MRN: [id number]  [name] is a 67 y.o. male who presents for follow-up of Type 2 diabetes mellitus.  Home blood sugar records: fasting range: 100 Current symptoms/problems include none and have been unchanged. Daily foot checks:   Any foot concerns: none Exercise: The patient does not participate in regular exercise at present. Diet: Regular. His main complaint today is a 2-week history of nocturia x3, hesitancy, decreased stream in the last couple of days dysuria.  No discharge, fever, chills. He continues on enalapril/HCTZ and having no difficulty with that.  He is also taking Jentadueto.  Continues on atorvastatin 40 mg. The following portions of the patient's history were reviewed and updated as appropriate: allergies, current medications, past medical history, past social history and problem list.  ROS as in subjective above.     Objective:    Physical Exam Alert and in no distress rectal exam shows an enlarged prostate that is nontender and normal in texture. Urine dipstick and microscopic was positive.  He does have red cells, white cells and bacteria.  Stool was guaiac positive  Blood pressure 122/68, pulse 73, temperature 98 F (36.7 C), weight 150 lb (68 kg), SpO2 97 %.  Lab Review Diabetic Labs Latest Ref Rng & Units 03/13/2017 11/06/2016 07/25/2016 02/28/2016 10/26/2015  HbA1c - 7.7 7.5 7.6 7.5 7.1  Microalbumin mg/L - - 35.0 - -  Micro/Creat Ratio - - - 13.4 - -  Chol <200 mg/dL - 119 - - -  HDL >40 mg/dL - 33(L) - - -  Calc LDL mg/dL (calc) - 66 - - -  Triglycerides <150 mg/dL - 123 - - -  Creatinine 0.70 - 1.25 mg/dL - 1.29(H) - - -   BP/Weight 08/05/2017 03/13/2017 11/06/2016 07/01/996 04/26/8248  Systolic BP 539 767 341 937 902  Diastolic BP 68 78 50 80 72  Wt. (Lbs) 150 157 153.4 155 163  BMI 20.92 21.9 21.39 21.62 22.73   Foot/eye exam completion dates Latest Ref Rng & Units  11/06/2016 09/03/2016  Eye Exam No Retinopathy - No Retinopathy  Foot Form Completion - Done -    [name]  reports that he quit smoking about 37 years ago. He has never used smokeless tobacco. He reports that he has current or past drug history. Drug: Marijuana. He reports that he does not drink alcohol.     Assessment & Plan:    Hyperlipidemia associated with type 2 diabetes mellitus ([place])  Controlled type 2 diabetes mellitus with complication, without long-term current use of insulin ([place]) - Plan: POCT glycosylated hemoglobin (Hb A1C)  Hypertension associated with diabetes ([place])  Former smoker, stopped smoking in distant past  Acute prostatitis - Plan: sulfamethoxazole-trimethoprim (BACTRIM DS,SEPTRA DS) 800-160 MG tablet, CULTURE, URINE COMPREHENSIVE, POCT Urinalysis DIP (Proadvantage Device)  Adenomatous polyp of colon, unspecified part of colon - Tubular adenoma   1. Rx changes: Septra 2. Education: Reviewed 'ABCs' of diabetes management (respective goals in parentheses):  A1C (<7), blood pressure (<130/80), and cholesterol (LDL <100). 3. Compliance at present is estimated to be good. Efforts to improve compliance (if necessary) will be directed at Continue present medication regimen. 4. Follow up: 4 months for follow-up on the diabetes with 2 weeks for follow-up on the urine. 5. We will also have him scheduled for follow-up colonoscopy since he is on his 3-year schedule.

## 2017-08-09 LAB — CULTURE, URINE COMPREHENSIVE

## 2017-08-20 ENCOUNTER — Ambulatory Visit (INDEPENDENT_AMBULATORY_CARE_PROVIDER_SITE_OTHER): Payer: Medicare Other | Admitting: Family Medicine

## 2017-08-20 ENCOUNTER — Encounter: Payer: Self-pay | Admitting: Family Medicine

## 2017-08-20 VITALS — BP 130/70 | HR 76 | Ht 71.0 in | Wt 151.0 lb

## 2017-08-20 DIAGNOSIS — N410 Acute prostatitis: Secondary | ICD-10-CM | POA: Diagnosis not present

## 2017-08-20 DIAGNOSIS — R319 Hematuria, unspecified: Secondary | ICD-10-CM

## 2017-08-20 LAB — POCT URINALYSIS DIP (PROADVANTAGE DEVICE)
BILIRUBIN UA: NEGATIVE mg/dL
Bilirubin, UA: NEGATIVE
Glucose, UA: NEGATIVE mg/dL
Leukocytes, UA: NEGATIVE
Nitrite, UA: NEGATIVE
PROTEIN UA: NEGATIVE mg/dL
SPECIFIC GRAVITY, URINE: 1.02
UUROB: NEGATIVE
pH, UA: 5.5 (ref 5.0–8.0)

## 2017-08-20 NOTE — Progress Notes (Signed)
   Subjective:    Patient ID: [name], male    DOB: 01-28-1951, 67 y.o.   MRN: [id number]  HPI He is here for recheck on his urine.  He is presently having no difficulty with decreased stream, hesitancy or nocturia.  He is back to his normal urinary pattern.   Review of Systems     Objective:   Physical Exam Alert and in no distress.  Dipstick is recorded.  Microscopic exam showed scattered red cells.       Assessment & Plan:  Hematuria, unspecified type - Plan: POCT Urinalysis DIP (Proadvantage Device)  Acute prostatitis His symptoms are really more consistent with prostate infection however I warned him that if his symptoms recur, Iwill refer him to urology.

## 2017-08-24 ENCOUNTER — Other Ambulatory Visit: Payer: Self-pay | Admitting: Family Medicine

## 2017-09-11 DIAGNOSIS — E119 Type 2 diabetes mellitus without complications: Secondary | ICD-10-CM | POA: Diagnosis not present

## 2017-09-11 DIAGNOSIS — H40033 Anatomical narrow angle, bilateral: Secondary | ICD-10-CM | POA: Diagnosis not present

## 2017-09-11 LAB — HM DIABETES EYE EXAM

## 2017-10-16 ENCOUNTER — Other Ambulatory Visit: Payer: Self-pay | Admitting: Family Medicine

## 2017-10-16 DIAGNOSIS — I10 Essential (primary) hypertension: Principal | ICD-10-CM

## 2017-10-16 DIAGNOSIS — E1159 Type 2 diabetes mellitus with other circulatory complications: Secondary | ICD-10-CM

## 2017-11-26 ENCOUNTER — Other Ambulatory Visit: Payer: Self-pay | Admitting: Family Medicine

## 2017-12-02 ENCOUNTER — Encounter: Payer: Self-pay | Admitting: Family Medicine

## 2017-12-07 ENCOUNTER — Ambulatory Visit: Payer: Medicare Other | Admitting: Family Medicine

## 2017-12-21 ENCOUNTER — Encounter: Payer: Self-pay | Admitting: Family Medicine

## 2017-12-21 ENCOUNTER — Ambulatory Visit (INDEPENDENT_AMBULATORY_CARE_PROVIDER_SITE_OTHER): Payer: Medicare Other | Admitting: Family Medicine

## 2017-12-21 ENCOUNTER — Encounter: Payer: Self-pay | Admitting: Gastroenterology

## 2017-12-21 VITALS — BP 140/86 | HR 81 | Temp 98.0°F | Wt 160.8 lb

## 2017-12-21 DIAGNOSIS — Z87891 Personal history of nicotine dependence: Secondary | ICD-10-CM | POA: Diagnosis not present

## 2017-12-21 DIAGNOSIS — E785 Hyperlipidemia, unspecified: Secondary | ICD-10-CM

## 2017-12-21 DIAGNOSIS — I10 Essential (primary) hypertension: Secondary | ICD-10-CM | POA: Diagnosis not present

## 2017-12-21 DIAGNOSIS — E1159 Type 2 diabetes mellitus with other circulatory complications: Secondary | ICD-10-CM | POA: Diagnosis not present

## 2017-12-21 DIAGNOSIS — E1169 Type 2 diabetes mellitus with other specified complication: Secondary | ICD-10-CM | POA: Diagnosis not present

## 2017-12-21 DIAGNOSIS — Z23 Encounter for immunization: Secondary | ICD-10-CM

## 2017-12-21 DIAGNOSIS — E118 Type 2 diabetes mellitus with unspecified complications: Secondary | ICD-10-CM | POA: Diagnosis not present

## 2017-12-21 LAB — POCT UA - MICROALBUMIN
Albumin/Creatinine Ratio, Urine, POC: 5.4
Creatinine, POC: 164.7 mg/dL
Microalbumin Ur, POC: 8.9 mg/L

## 2017-12-21 LAB — POCT GLYCOSYLATED HEMOGLOBIN (HGB A1C): HEMOGLOBIN A1C: 7.2 % — AB (ref 4.0–5.6)

## 2017-12-21 NOTE — Progress Notes (Signed)
  Subjective:    Patient ID: [name], male    DOB: Aug 15, 1950, 67 y.o.   MRN: [id number]  [name] is a 67 y.o. male who presents for follow-up of Type 2 diabetes mellitus.  Patient is checking home blood sugars.   Home blood sugar records: meter record How often is blood sugars being checked: once a week 140 avg. Current symptoms/problems include none and have been unchanged. Daily foot checks: yes   Any foot concerns: right great toe Last eye exam: 06/2017 Exercise: walking / fishing He continues on Jentadueto and having no difficulty with that.  He is also taking enalapril/HCTZ as well as atorvastatin.  He is having no difficulty with any of those.  Still does smoke and occasional marijuana. The following portions of the patient's history were reviewed and updated as appropriate: allergies, current medications, past medical history, past social history and problem list.  ROS as in subjective above.     Objective:    Physical Exam Alert and in no distress otherwise not examined.  A1c is 7.2 Lab Review Diabetic Labs Latest Ref Rng & Units 08/05/2017 03/13/2017 11/06/2016 07/25/2016 02/28/2016  HbA1c 4.0 - 5.6 % 7.1(A) 7.7 7.5 7.6 7.5  Microalbumin mg/L - - - 35.0 -  Micro/Creat Ratio - - - - 13.4 -  Chol <200 mg/dL - - 119 - -  HDL >40 mg/dL - - 33(L) - -  Calc LDL mg/dL (calc) - - 66 - -  Triglycerides <150 mg/dL - - 123 - -  Creatinine 0.70 - 1.25 mg/dL - - 1.29(H) - -   BP/Weight 08/20/2017 08/05/2017 03/13/2017 8/93/7342 09/30/6809  Systolic BP 572 620 355 974 163  Diastolic BP 70 68 78 50 80  Wt. (Lbs) 151 150 157 153.4 155  BMI 21.06 20.92 21.9 21.39 21.62   Foot/eye exam completion dates Latest Ref Rng & Units 11/06/2016 09/03/2016  Eye Exam No Retinopathy - No Retinopathy  Foot Form Completion - Done -    [name]  reports that he quit smoking about 37 years ago. He has never used smokeless tobacco. He reports that he has current or past drug history. Drug:  Marijuana. He reports that he does not drink alcohol.     Assessment & Plan:    Controlled type 2 diabetes mellitus with complication, without long-term current use of insulin ([place]) - Plan: CBC with Differential/Platelet, Comprehensive metabolic panel, Lipid panel, POCT UA - Microalbumin, POCT glycosylated hemoglobin (Hb A1C)  Need for influenza vaccination - Plan: Flu vaccine HIGH DOSE PF (Fluzone High dose)  Hyperlipidemia associated with type 2 diabetes mellitus (H[facility]) - Plan: Lipid panel  Hypertension associated with diabetes ([place]) - Plan: CBC with Differential/Platelet, Comprehensive metabolic panel  Former smoker, stopped smoking in distant past    1. Rx changes: none 2. Education: Reviewed 'ABCs' of diabetes management (respective goals in parentheses):  A1C (<7), blood pressure (<130/80), and cholesterol (LDL <100). 3. Compliance at present is estimated to be good. Efforts to improve compliance (if necessary) will be directed at none. 4. Follow up: 4 months

## 2017-12-22 LAB — LIPID PANEL
CHOL/HDL RATIO: 3.8 ratio (ref 0.0–5.0)
Cholesterol, Total: 143 mg/dL (ref 100–199)
HDL: 38 mg/dL — AB (ref >39)
LDL CALC: 83 mg/dL (ref 0–99)
TRIGLYCERIDES: 109 mg/dL (ref 0–149)
VLDL CHOLESTEROL CAL: 22 mg/dL (ref 5–40)

## 2017-12-22 LAB — CBC WITH DIFFERENTIAL/PLATELET
BASOS ABS: 0 10*3/uL (ref 0.0–0.2)
Basos: 0 %
EOS (ABSOLUTE): 0.3 10*3/uL (ref 0.0–0.4)
EOS: 4 %
Hematocrit: 43.1 % (ref 37.5–51.0)
Hemoglobin: 14.5 g/dL (ref 13.0–17.7)
IMMATURE GRANS (ABS): 0 10*3/uL (ref 0.0–0.1)
Immature Granulocytes: 0 %
LYMPHS ABS: 2.6 10*3/uL (ref 0.7–3.1)
Lymphs: 31 %
MCH: 29.5 pg (ref 26.6–33.0)
MCHC: 33.6 g/dL (ref 31.5–35.7)
MCV: 88 fL (ref 79–97)
MONOCYTES: 9 %
Monocytes Absolute: 0.7 10*3/uL (ref 0.1–0.9)
Neutrophils Absolute: 4.7 10*3/uL (ref 1.4–7.0)
Neutrophils: 56 %
PLATELETS: 257 10*3/uL (ref 150–450)
RBC: 4.91 x10E6/uL (ref 4.14–5.80)
RDW: 13.3 % (ref 12.3–15.4)
WBC: 8.5 10*3/uL (ref 3.4–10.8)

## 2017-12-22 LAB — COMPREHENSIVE METABOLIC PANEL
ALT: 20 IU/L (ref 0–44)
AST: 22 IU/L (ref 0–40)
Albumin/Globulin Ratio: 1.4 (ref 1.2–2.2)
Albumin: 4.3 g/dL (ref 3.6–4.8)
Alkaline Phosphatase: 59 IU/L (ref 39–117)
BUN/Creatinine Ratio: 23 (ref 10–24)
BUN: 30 mg/dL — AB (ref 8–27)
Bilirubin Total: 0.3 mg/dL (ref 0.0–1.2)
CALCIUM: 9.9 mg/dL (ref 8.6–10.2)
CO2: 22 mmol/L (ref 20–29)
CREATININE: 1.33 mg/dL — AB (ref 0.76–1.27)
Chloride: 104 mmol/L (ref 96–106)
GFR, EST AFRICAN AMERICAN: 64 mL/min/{1.73_m2} (ref >59)
GFR, EST NON AFRICAN AMERICAN: 55 mL/min/{1.73_m2} — AB (ref >59)
GLUCOSE: 165 mg/dL — AB (ref 65–99)
Globulin, Total: 3.1 g/dL (ref 1.5–4.5)
Potassium: 4.7 mmol/L (ref 3.5–5.2)
Sodium: 141 mmol/L (ref 134–144)
TOTAL PROTEIN: 7.4 g/dL (ref 6.0–8.5)

## 2018-02-02 ENCOUNTER — Telehealth: Payer: Self-pay | Admitting: Family Medicine

## 2018-02-02 NOTE — Telephone Encounter (Signed)
Wife dropped off Pt Assistance Jentadueta XR application, his is expiring at the end of the year.

## 2018-02-04 ENCOUNTER — Telehealth: Payer: Self-pay | Admitting: Family Medicine

## 2018-02-04 ENCOUNTER — Other Ambulatory Visit: Payer: Self-pay

## 2018-02-04 ENCOUNTER — Telehealth: Payer: Self-pay

## 2018-02-04 MED ORDER — LINAGLIPTIN-METFORMIN HCL ER 5-1000 MG PO TB24: 1.0000 | ORAL_TABLET | Freq: Every day | ORAL | 3 refills | Status: DC

## 2018-02-04 NOTE — Telephone Encounter (Signed)
Printing attempt

## 2018-02-05 NOTE — Telephone Encounter (Signed)
PT ASSISTANCE application JENTADUETO completed and faxed

## 2018-02-24 ENCOUNTER — Other Ambulatory Visit: Payer: Self-pay | Admitting: Family Medicine

## 2018-04-27 ENCOUNTER — Telehealth: Payer: Self-pay

## 2018-04-27 ENCOUNTER — Encounter: Payer: Self-pay | Admitting: Family Medicine

## 2018-04-27 ENCOUNTER — Ambulatory Visit (INDEPENDENT_AMBULATORY_CARE_PROVIDER_SITE_OTHER): Payer: Medicare Other | Admitting: Family Medicine

## 2018-04-27 VITALS — BP 130/86 | HR 71 | Temp 98.0°F | Ht 71.0 in | Wt 168.4 lb

## 2018-04-27 DIAGNOSIS — N529 Male erectile dysfunction, unspecified: Secondary | ICD-10-CM

## 2018-04-27 DIAGNOSIS — I10 Essential (primary) hypertension: Secondary | ICD-10-CM

## 2018-04-27 DIAGNOSIS — Z0000 Encounter for general adult medical examination without abnormal findings: Secondary | ICD-10-CM

## 2018-04-27 DIAGNOSIS — E1169 Type 2 diabetes mellitus with other specified complication: Secondary | ICD-10-CM

## 2018-04-27 DIAGNOSIS — E118 Type 2 diabetes mellitus with unspecified complications: Secondary | ICD-10-CM | POA: Diagnosis not present

## 2018-04-27 DIAGNOSIS — D126 Benign neoplasm of colon, unspecified: Secondary | ICD-10-CM

## 2018-04-27 DIAGNOSIS — E1159 Type 2 diabetes mellitus with other circulatory complications: Secondary | ICD-10-CM | POA: Diagnosis not present

## 2018-04-27 DIAGNOSIS — Z87891 Personal history of nicotine dependence: Secondary | ICD-10-CM

## 2018-04-27 DIAGNOSIS — E785 Hyperlipidemia, unspecified: Secondary | ICD-10-CM

## 2018-04-27 LAB — POCT URINALYSIS DIP (PROADVANTAGE DEVICE)
BILIRUBIN UA: NEGATIVE
BILIRUBIN UA: NEGATIVE mg/dL
Glucose, UA: NEGATIVE mg/dL
Leukocytes, UA: NEGATIVE
Nitrite, UA: NEGATIVE
Protein Ur, POC: NEGATIVE mg/dL
SPECIFIC GRAVITY, URINE: 1.025
UUROB: 3.5
pH, UA: 5.5 (ref 5.0–8.0)

## 2018-04-27 LAB — POCT GLYCOSYLATED HEMOGLOBIN (HGB A1C): HEMOGLOBIN A1C: 8 % — AB (ref 4.0–5.6)

## 2018-04-27 NOTE — Patient Instructions (Signed)
Check out the [employer] or the [place] or go to the American diabetes Association website.

## 2018-04-27 NOTE — Progress Notes (Signed)
[name] is a 68 y.o. male who presents for annual wellness visit,CPE and follow-up on chronic medical conditions.  He has a history of diabetes and presently is taking Jentadueto.  He is having no difficulty with that medicine.  He states that his wife is the main cook.  He continues on atorvastatin for his cholesterol as well as an Alocril/HCTZ for his blood pressure.  He is having no difficulty with any of them.  He does take a multivitamin as well as aspirin.  Does not complain of any erectile dysfunction problem.  He does have a history of adenomatous colonic polyps.  He is also a former smoker but has had an ultrasound.  He has no other concerns or complaints  Immunizations and Health Maintenance Immunization History  Administered Date(s) Administered  . Influenza Split 02/20/2011, 12/25/2012  . Influenza, High Dose Seasonal PF 12/21/2017  . Influenza,inj,Quad PF,6+ Mos 11/10/2013, 10/26/2015, 11/06/2016  . Pneumococcal Conjugate-13 11/10/2013  . Pneumococcal Polysaccharide-23 10/15/2010  . Tdap 02/25/2007, 07/25/2016  . Zoster 02/09/2012  . Zoster Recombinat (Shingrix) 07/25/2016, 11/06/2016   Health Maintenance Due  Topic Date Due  . PNA vac Low Risk Adult (2 of 2 - PPSV23) 11/17/2016  . FOOT EXAM  11/06/2017  . COLONOSCOPY  12/20/2017    Last colonoscopy:10/27.16 Last PSA: unkown Dentist: over a year Ophtho:09/11/17 Exercise: walking  Other doctors caring for patient include: [facility]  Yetta Flock, MD  Advanced Directives: No.  Information given    Depression screen:  See questionnaire below.     Depression screen [facility] 2/9 12/21/2017 07/25/2016 10/26/2015 03/13/2014 02/14/2013  Decreased Interest 0 0 0 0 0  Down, Depressed, Hopeless 0 0 0 0 0  PHQ - 2 Score 0 0 0 0 0    Fall Screen: See Questionaire below.   Fall Risk  12/21/2017 07/25/2016 10/26/2015 03/13/2014 02/09/2012  Falls in the past year? No No No No No    ADL screen:  See questionnaire below.   Functional Status Survey:     Review of Systems  Constitutional: -, -unexpected weight change, -anorexia, -fatigue Allergy: -sneezing, -itching, -congestion Dermatology: denies changing moles, rash, lumps ENT: -runny nose, -ear pain, -sore throat,  Cardiology:  -chest pain, -palpitations, -orthopnea, Respiratory: -cough, -shortness of breath, -dyspnea on exertion, -wheezing,  Gastroenterology: -abdominal pain, -nausea, -vomiting, -diarrhea, -constipation, -dysphagia Hematology: -bleeding or bruising problems Musculoskeletal: -arthralgias, -myalgias, -joint swelling, -back pain, - Ophthalmology: -vision changes,  Urology: -dysuria, -difficulty urinating,  -urinary frequency, -urgency, incontinence Neurology: -, -numbness, , -memory loss, -falls, -dizziness    PHYSICAL EXAM:    General Appearance: Alert, cooperative, no distress, appears stated age Head: Normocephalic, without obvious abnormality, atraumatic Eyes: PERRL, conjunctiva/corneas clear, EOM's intact, fundi benign Ears: Normal TM's and external ear canals Nose: Nares normal, mucosa normal, no drainage or sinus   tenderness Throat: Lips, mucosa, and tongue normal; teeth and gums normal Neck: Supple, no lymphadenopathy, thyroid:no enlargement/tenderness/nodules; no carotid bruit or JVD Lungs: Clear to auscultation bilaterally without wheezes, rales or ronchi; respirations unlabored Heart: Regular rate and rhythm, S1 and S2 normal, no murmur, rub or gallop Abdomen: Soft, non-tender, nondistended, normoactive bowel sounds, no masses, no hepatosplenomegaly Extremities: No clubbing, cyanosis or edema Pulses: 2+ and symmetric all extremities Skin: Skin color, texture, turgor normal, no rashes or lesions Lymph nodes: Cervical, supraclavicular, and axillary nodes normal Neurologic: CNII-XII intact, normal strength, sensation and gait; reflexes 2+ and symmetric throughout   Psych: Normal mood, affect, hygiene and grooming A1c  is 8.0 ASSESSMENT/PLAN: Hyperlipidemia associated with type 2 diabetes mellitus ([place])  Controlled type 2 diabetes mellitus with complication, without long-term current use of insulin ([place]) - Plan: POCT glycosylated hemoglobin (Hb A1C), POCT Urinalysis DIP (Proadvantage Device)  Hypertension associated with diabetes ([place])  Adenomatous polyp of colon, unspecified part of colon  Former smoker, stopped smoking in distant past  ED (erectile dysfunction) of organic origin Discussed him doing a better job of taking care of himself in terms of diet and exercise.  Also discussed possibly adding another medication to his regimen concerning his diabetes.  He plans to do a better job of taking care of himself.  We will also follow-up concerning when he needs his next colonoscopy. . Immunization recommendations discussed.  Colonoscopy recommendations reviewed.   Medicare Attestation I have personally reviewed: The patient's medical and social history Their use of alcohol, tobacco or illicit drugs Their current medications and supplements The patient's functional ability including ADLs,fall risks, home safety risks, cognitive, and hearing and visual impairment Diet and physical activities Evidence for depression or mood disorders  The patient's weight, height, and BMI have been recorded in the chart.  I have made referrals, counseling, and provided education to the patient based on review of the above and I have provided the patient with a written personalized care plan for preventive services.     Jill Alexanders, MD   04/27/2018

## 2018-04-27 NOTE — Telephone Encounter (Signed)
Call and remind him

## 2018-04-27 NOTE — Telephone Encounter (Signed)
Pt is to have coloscopy Q three years. He is over due and was contacted by their office. Pt has not responded to their letter. [place]

## 2018-04-29 NOTE — Telephone Encounter (Signed)
Called number listed for pt lvm fom him to call back . [place]

## 2018-05-03 ENCOUNTER — Other Ambulatory Visit: Payer: Self-pay

## 2018-05-03 DIAGNOSIS — D126 Benign neoplasm of colon, unspecified: Secondary | ICD-10-CM

## 2018-05-07 ENCOUNTER — Encounter: Payer: Self-pay | Admitting: Gastroenterology

## 2018-05-21 ENCOUNTER — Other Ambulatory Visit: Payer: Self-pay | Admitting: Family Medicine

## 2018-06-02 ENCOUNTER — Ambulatory Visit (AMBULATORY_SURGERY_CENTER): Payer: Self-pay | Admitting: *Deleted

## 2018-06-02 ENCOUNTER — Telehealth: Payer: Self-pay | Admitting: Family Medicine

## 2018-06-02 ENCOUNTER — Other Ambulatory Visit: Payer: Self-pay

## 2018-06-02 VITALS — Ht 71.0 in | Wt 164.0 lb

## 2018-06-02 DIAGNOSIS — Z86010 Personal history of colonic polyps: Secondary | ICD-10-CM

## 2018-06-02 MED ORDER — NA SULFATE-K SULFATE-MG SULF 17.5-3.13-1.6 GM/177ML PO SOLN: 1.0000 | Freq: Once | ORAL | 0 refills | Status: AC

## 2018-06-02 NOTE — Telephone Encounter (Signed)
Pt called and is requesting all of his refill [facility] from now on

## 2018-06-02 NOTE — Progress Notes (Signed)
Denies allergies to eggs or soy products. Denies complications with sedation or anesthesia. Denies O2 use. Denies use of diet or weight loss medications.  Emmi instructions given for colonoscopy.  Packet mailed to patient with instructions and Preprocedure Acknowledgement to be returned in self addressed stamped envelope.

## 2018-06-02 NOTE — Telephone Encounter (Signed)
Ok changed in chart [facility]

## 2018-06-07 ENCOUNTER — Telehealth: Payer: Self-pay | Admitting: Gastroenterology

## 2018-06-07 DIAGNOSIS — Z86010 Personal history of colonic polyps: Secondary | ICD-10-CM

## 2018-06-07 NOTE — Telephone Encounter (Signed)
Pt called would like to know if there is a cheaper prep  For the colonoscopy he can get.

## 2018-06-08 MED ORDER — PEG 3350-KCL-NA BICARB-NACL 420 G PO SOLR: 4000.0000 mL | Freq: Once | ORAL | 0 refills | Status: AC

## 2018-06-08 NOTE — Telephone Encounter (Signed)
Spoke with pt.  New RX of Kiskimere sent to pharmacy and new instructions mailed to patient.  Told to call back if he has any questions.

## 2018-06-10 ENCOUNTER — Encounter: Payer: Self-pay | Admitting: Gastroenterology

## 2018-06-28 ENCOUNTER — Encounter: Payer: Self-pay | Admitting: Gastroenterology

## 2018-07-02 ENCOUNTER — Encounter: Payer: Self-pay | Admitting: Gastroenterology

## 2018-07-14 ENCOUNTER — Telehealth: Payer: Self-pay | Admitting: *Deleted

## 2018-07-14 NOTE — Telephone Encounter (Signed)
No answer for first attempt COVID screen. Will call back.

## 2018-07-14 NOTE — Telephone Encounter (Signed)
Covid-19 travel screening questions  Have you traveled in the last 14 days?no If yes where?  Do you now or have you had a fever in the last 14 days?no  Do you have any respiratory symptoms of shortness of breath or cough now or in the last 14 days?no  Do you have a medical history of Congestive Heart Failure?  Do you have a medical history of lung disease?  Do you have any family members or close contacts with diagnosed or suspected Covid-19?no  PT aware of care partner policy and will bring a mask with them SM

## 2018-07-16 ENCOUNTER — Ambulatory Visit (AMBULATORY_SURGERY_CENTER): Payer: Medicare Other | Admitting: Gastroenterology

## 2018-07-16 ENCOUNTER — Encounter: Payer: Self-pay | Admitting: Gastroenterology

## 2018-07-16 ENCOUNTER — Other Ambulatory Visit: Payer: Self-pay

## 2018-07-16 VITALS — BP 131/74 | HR 63 | Temp 98.9°F | Resp 13 | Ht 71.0 in | Wt 164.0 lb

## 2018-07-16 DIAGNOSIS — Z86010 Personal history of colonic polyps: Secondary | ICD-10-CM | POA: Diagnosis not present

## 2018-07-16 DIAGNOSIS — D123 Benign neoplasm of transverse colon: Secondary | ICD-10-CM

## 2018-07-16 MED ORDER — SODIUM CHLORIDE 0.9 % IV SOLN: 500.0000 mL | Freq: Once | INTRAVENOUS | Status: AC

## 2018-07-16 NOTE — Progress Notes (Signed)
Called to room to assist during endoscopic procedure.  Patient ID and intended procedure confirmed with present staff. Received instructions for my participation in the procedure from the performing physician.  

## 2018-07-16 NOTE — Progress Notes (Signed)
[place], [place], CMA - VS

## 2018-07-16 NOTE — Progress Notes (Signed)
Report to PACU, RN, vss, BBS= Clear.  

## 2018-07-16 NOTE — Op Note (Signed)
[place] Patient Name: [name] Procedure Date: 07/16/2018 11:18 AM MRN: [id number] Endoscopist: Remo Lipps P. Havery Moros , MD Age: 68 Referring MD:  Date of Birth: Jul 10, 1950 Gender: Male Account #: [id number] Procedure:                Colonoscopy Indications:              Surveillance: Personal history of adenomatous                            polyps on last colonoscopy > 3 years ago Medicines:                Monitored Anesthesia Care Procedure:                Pre-Anesthesia Assessment:                           - Prior to the procedure, a History and Physical                            was performed, and patient medications and                            allergies were reviewed. The patient's tolerance of                            previous anesthesia was also reviewed. The risks                            and benefits of the procedure and the sedation                            options and risks were discussed with the patient.                            All questions were answered, and informed consent                            was obtained. Prior Anticoagulants: The patient has                            taken no previous anticoagulant or antiplatelet                            agents. ASA Grade Assessment: II - A patient with                            mild systemic disease. After reviewing the risks                            and benefits, the patient was deemed in                            satisfactory condition to undergo the procedure.  After obtaining informed consent, the colonoscope                            was passed under direct vision. Throughout the                            procedure, the patient's blood pressure, pulse, and                            oxygen saturations were monitored continuously. The                            Model PCF-[id number]) scope was introduced                            through the  anus and advanced to the the cecum,                            identified by appendiceal orifice and ileocecal                            valve. The colonoscopy was performed without                            difficulty. The patient tolerated the procedure                            well. The quality of the bowel preparation was                            good. The ileocecal valve, appendiceal orifice, and                            rectum were photographed. Scope In: 11:32:44 AM Scope Out: 11:54:56 AM Scope Withdrawal Time: 0 hours 16 minutes 50 seconds  Total Procedure Duration: 0 hours 22 minutes 12 seconds  Findings:                 The perianal and digital rectal examinations were                            normal.                           A 4 mm polyp was found in the transverse colon. The                            polyp was sessile. The polyp was removed with a                            cold snare. Resection and retrieval were complete.                           Many medium-mouthed diverticula were found in the  entire colon, mild in right and transverse, severe                            in the left colon with narrowed lumen. Pediatric                            colonoscope used to complete this procedure.                           The exam was otherwise without abnormality. Complications:            No immediate complications. Estimated blood loss:                            Minimal. Estimated Blood Loss:     Estimated blood loss was minimal. Impression:               - One 4 mm polyp in the transverse colon, removed                            with a cold snare. Resected and retrieved.                           - Diverticulosis in the entire examined colon,                            severe in the left colon - would use pediatric                            colonoscope in the future for this patient to                            traverse sigmoid  colon.                           - The examination was otherwise normal. Recommendation:           - Patient has a contact number available for                            emergencies. The signs and symptoms of potential                            delayed complications were discussed with the                            patient. Return to normal activities tomorrow.                            Written discharge instructions were provided to the                            patient.                           -  Resume previous diet.                           - Continue present medications.                           - Await pathology results. Remo Lipps P. Miles Borkowski, MD 07/16/2018 12:01:50 PM This report has been signed electronically.

## 2018-07-16 NOTE — Patient Instructions (Signed)
YOU HAD AN ENDOSCOPIC PROCEDURE TODAY AT [facility]:   Refer to the procedure report that was given to you for any specific questions about what was found during the examination.  If the procedure report does not answer your questions, please call your gastroenterologist to clarify.  If you requested that your care partner not be given the details of your procedure findings, then the procedure report has been included in a sealed envelope for you to review at your convenience later.  YOU SHOULD EXPECT: Some feelings of bloating in the abdomen. Passage of more gas than usual.  Walking can help get rid of the air that was put into your GI tract during the procedure and reduce the bloating. If you had a lower endoscopy (such as a colonoscopy or flexible sigmoidoscopy) you may notice spotting of blood in your stool or on the toilet paper. If you underwent a bowel prep for your procedure, you may not have a normal bowel movement for a few days.  Please Note:  You might notice some irritation and congestion in your nose or some drainage.  This is from the oxygen used during your procedure.  There is no need for concern and it should clear up in a day or so.  SYMPTOMS TO REPORT IMMEDIATELY:   Following lower endoscopy (colonoscopy or flexible sigmoidoscopy):  Excessive amounts of blood in the stool  Significant tenderness or worsening of abdominal pains  Swelling of the abdomen that is new, acute  Fever of 100F or higher  For urgent or emergent issues, a gastroenterologist can be reached at any hour by calling [phone].   DIET:  We do recommend a small meal at first, but then you may proceed to your regular diet.  Drink plenty of fluids but you should avoid alcoholic beverages for 24 hours.  MEDICATIONS:  Continue present medications.  Please see handouts given to you by your recovery nurse.  ACTIVITY:  You should plan to take it easy for the rest of today and you should NOT  DRIVE or use heavy machinery until tomorrow (because of the sedation medicines used during the test).    FOLLOW UP: Our staff will call the number listed on your records 48-72 hours following your procedure to check on you and address any questions or concerns that you may have regarding the information given to you following your procedure. If we do not reach you, we will leave a message.  We will attempt to reach you two times.  During this call, we will ask if you have developed any symptoms of COVID 19. If you develop any symptoms (for example fever, flu-like symptoms, shortness of breath, cough etc.) before then, please call [phone].  If any biopsies were taken you will be contacted by phone or by letter within the next 1-3 weeks.  Please call us at [phone] if you have not heard about the biopsies in 3 weeks.   Thank you for allowing [place] to provide for your healthcare needs today.  SIGNATURES/CONFIDENTIALITY: You and/or your care partner have signed paperwork which will be entered into your electronic medical record.  These signatures attest to the fact that that the information above on your After Visit Summary has been reviewed and is understood.  Full responsibility of the confidentiality of this discharge information lies with you and/or your care-partner.

## 2018-07-20 ENCOUNTER — Telehealth: Payer: Self-pay

## 2018-07-20 NOTE — Telephone Encounter (Signed)
  Follow up Call-  Call back number 07/16/2018  Post procedure Call Back phone  # [phone] cell  Permission to leave phone message Yes  Some recent data might be hidden     Patient questions:  Do you have a fever, pain , or abdominal swelling? No. Pain Score  0 *  Have you tolerated food without any problems? Yes.    Have you been able to return to your normal activities? Yes.    Do you have any questions about your discharge instructions: Diet   No. Medications  No. Follow up visit  No.  Do you have questions or concerns about your Care? No.  Actions: * If pain score is 4 or above: No action needed, pain <4.  1. Have you developed a fever since your procedure? no  2.   Have you had an respiratory symptoms (SOB or cough) since your procedure? no  3.   Have you tested positive for COVID 19 since your procedure no  4.   Have you had any family members/close contacts diagnosed with the COVID 19 since your procedure?  no   If any of these questions are a yes, please inquire if patient has been seen by family doctor and route this note to Joylene John, Therapist, sports.

## 2018-07-22 ENCOUNTER — Encounter: Payer: Self-pay | Admitting: Gastroenterology

## 2018-08-03 ENCOUNTER — Other Ambulatory Visit: Payer: Self-pay | Admitting: Family Medicine

## 2018-08-03 DIAGNOSIS — E1159 Type 2 diabetes mellitus with other circulatory complications: Secondary | ICD-10-CM

## 2018-08-03 DIAGNOSIS — I152 Hypertension secondary to endocrine disorders: Secondary | ICD-10-CM

## 2018-08-20 ENCOUNTER — Other Ambulatory Visit: Payer: Self-pay

## 2018-08-20 ENCOUNTER — Telehealth: Payer: Self-pay | Admitting: Family Medicine

## 2018-08-20 MED ORDER — ATORVASTATIN CALCIUM 40 MG PO TABS: 40.0000 mg | ORAL_TABLET | Freq: Every day | ORAL | 0 refills | Status: DC

## 2018-08-20 NOTE — Telephone Encounter (Signed)
Pt was advsie and med was sent in. [facility]

## 2018-08-20 NOTE — Telephone Encounter (Signed)
[facility]. Pt's wife called for refills of Atorvastatin. Please send to [place]. Pt can be reached at [phone].

## 2018-10-08 ENCOUNTER — Other Ambulatory Visit: Payer: Self-pay | Admitting: Family Medicine

## 2018-10-20 IMAGING — US US ABDOMINAL AORTA SCREENING AAA
1 series · 14 of 20 positions shown · non-contrast
Comparison: None.

CLINICAL DATA: 66-year-old male with a history of smoking

EXAM:
ULTRASOUND OF ABDOMINAL AORTA
TECHNIQUE: Ultrasound examination of the abdominal aorta was performed to
evaluate for abdominal aortic aneurysm.

[Series 1: us abdominal aorta screening aaa · 0.25mm/px · 14 of 20 slices shown]
[im 1/20]
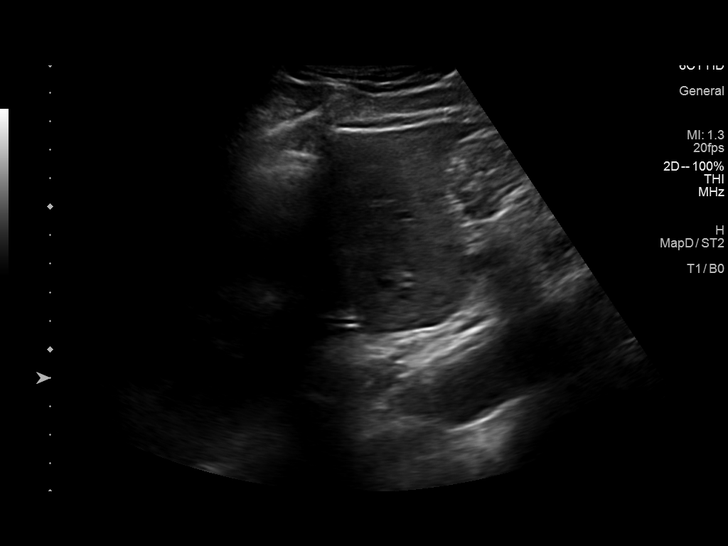
[im 3/20]
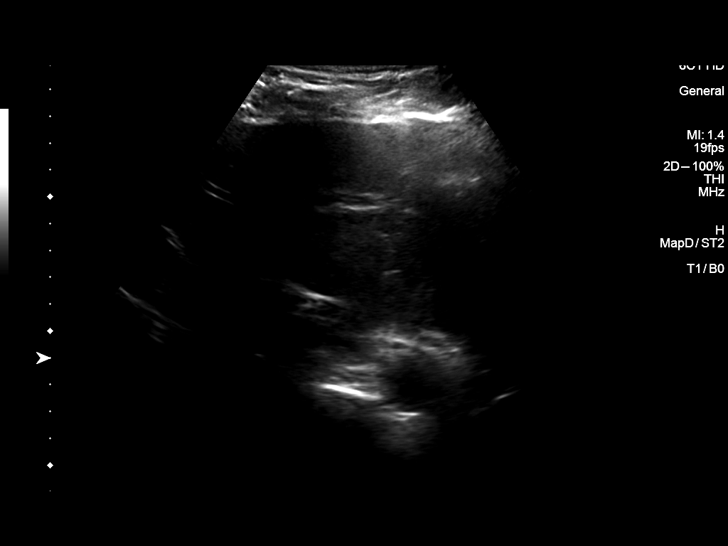
[im 4/20]
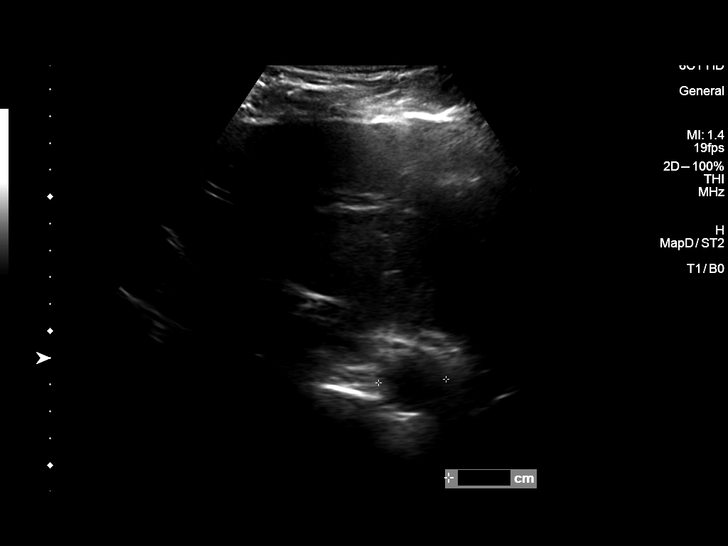
[im 6/20]
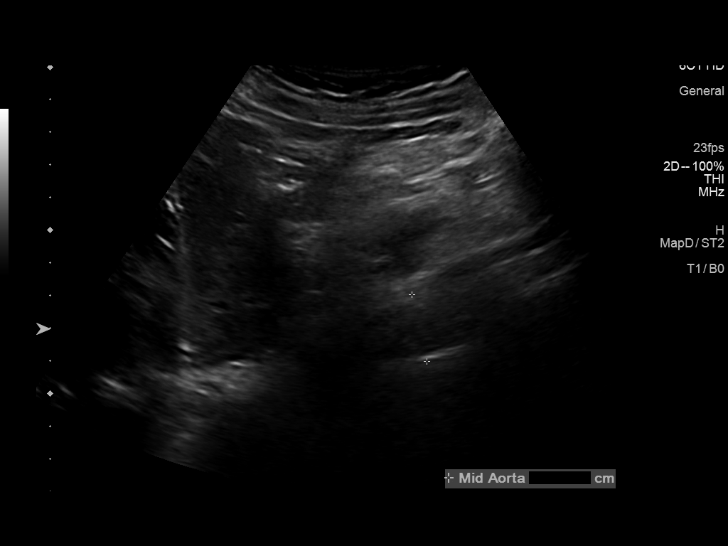
[im 7/20]
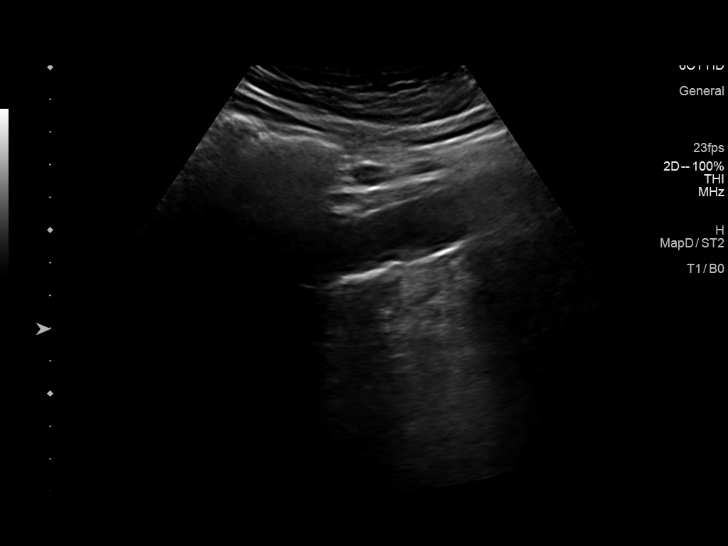
[im 8/20]
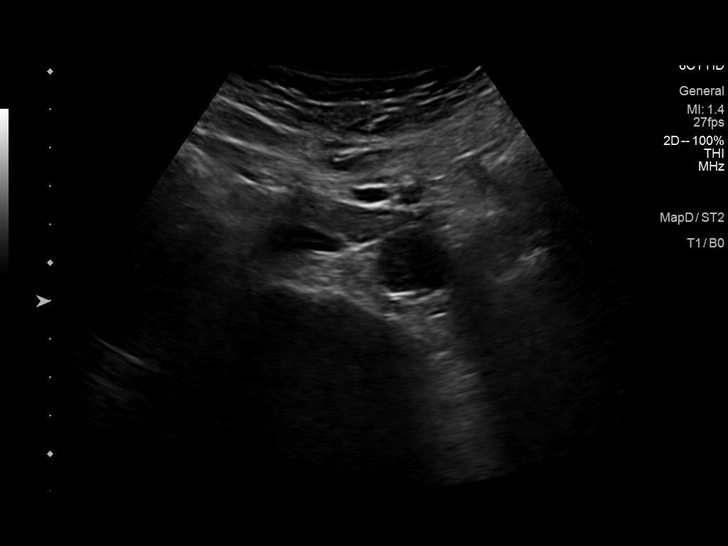
[im 10/20]
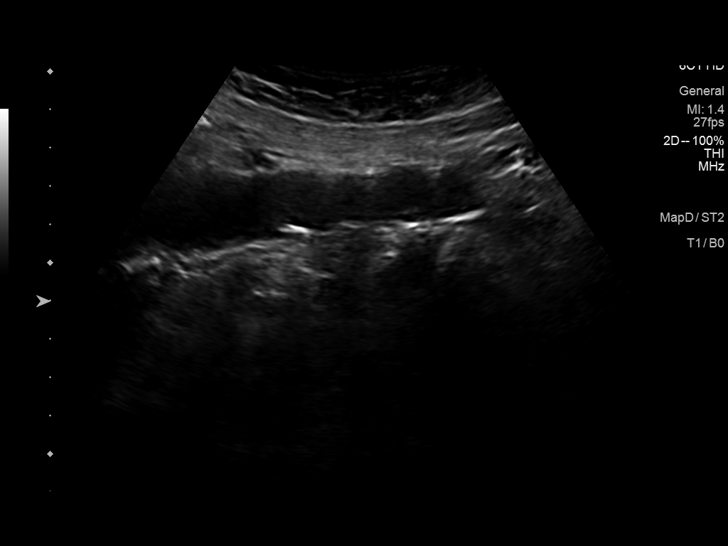
[im 11/20]
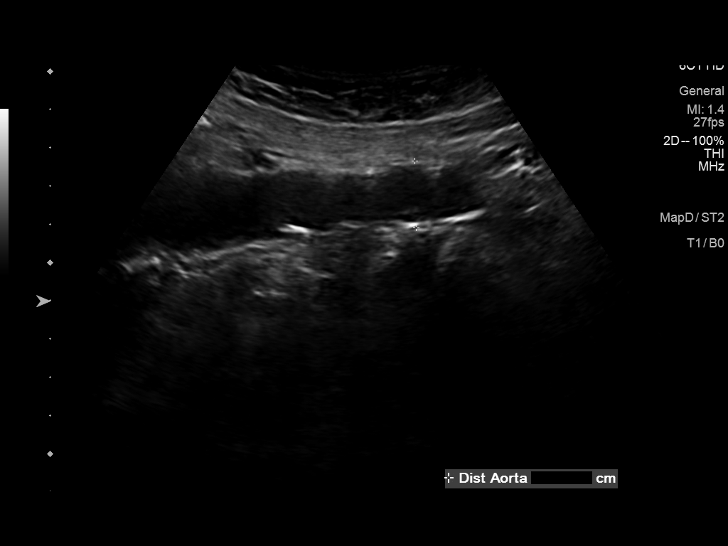
[im 13/20]
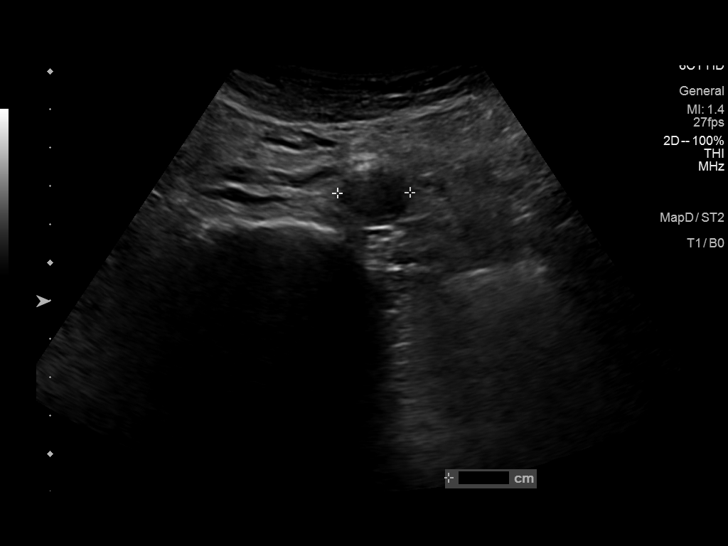
[im 14/20]
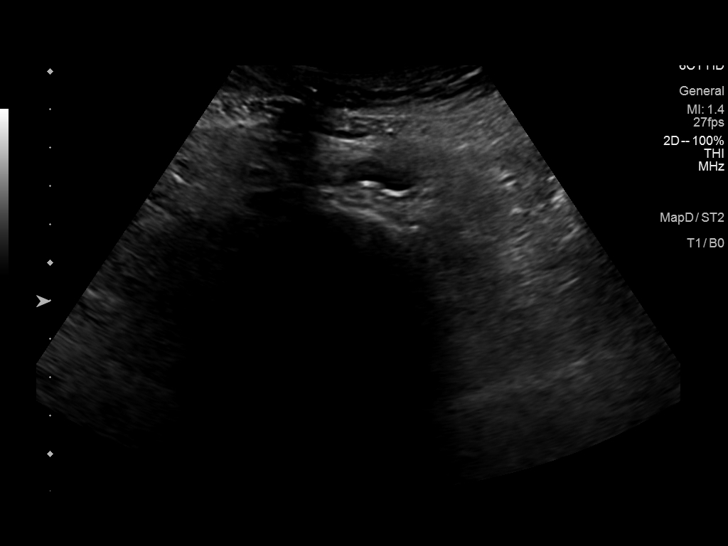
[im 16/20]
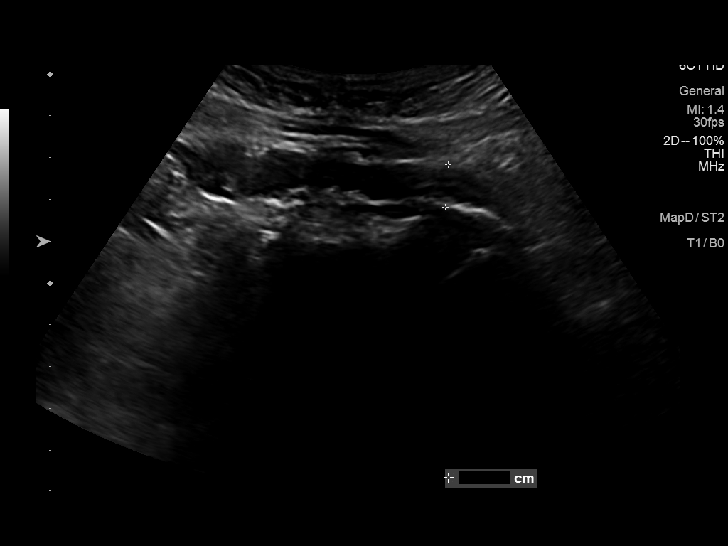
[im 17/20]
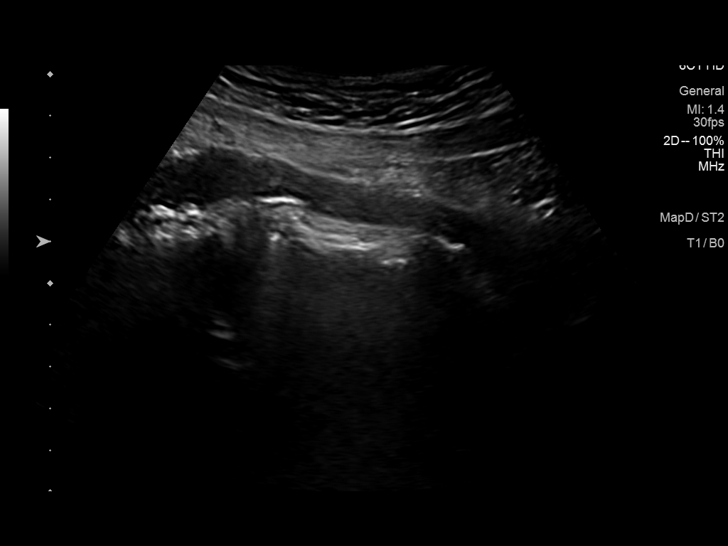
[im 18/20]
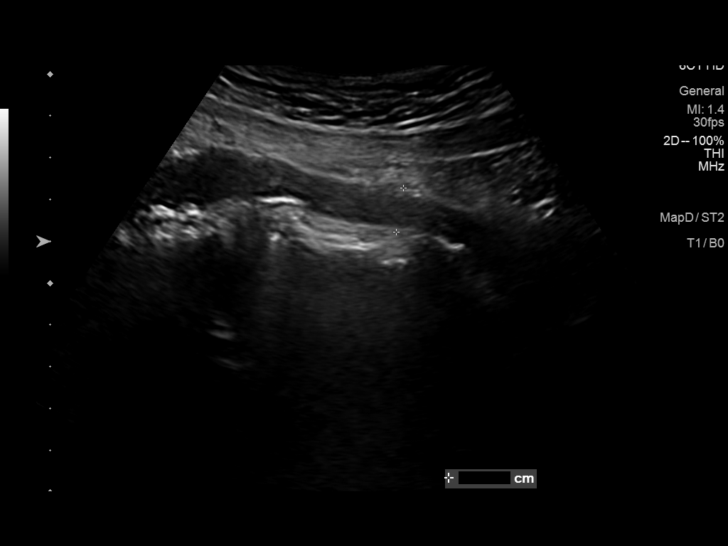
[im 20/20]
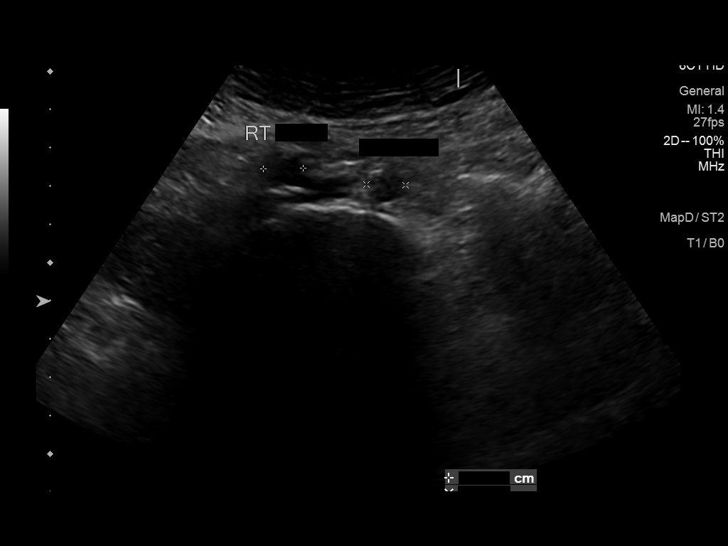

[14 of 20 positions shown; findings below may reference images not displayed]

FINDINGS: Abdominal aortic measurements as follows:

Proximal:  2.5 cm

Mid:  2.1 cm

Distal:  1.9 cm
IMPRESSION: Ectatic abdominal aorta at risk for aneurysm development. Recommend
followup by ultrasound in 5 years. This recommendation follows ACR
consensus guidelines: White Paper of the ACR Incidental Findings
Committee II on Vascular Findings. [HOSPITAL] 5156;

## 2018-10-27 ENCOUNTER — Encounter: Payer: Self-pay | Admitting: Family Medicine

## 2018-10-27 ENCOUNTER — Ambulatory Visit (INDEPENDENT_AMBULATORY_CARE_PROVIDER_SITE_OTHER): Payer: Medicare Other | Admitting: Family Medicine

## 2018-10-27 ENCOUNTER — Other Ambulatory Visit: Payer: Self-pay

## 2018-10-27 VITALS — BP 136/82 | HR 73 | Temp 98.0°F | Wt 158.6 lb

## 2018-10-27 DIAGNOSIS — E1159 Type 2 diabetes mellitus with other circulatory complications: Secondary | ICD-10-CM

## 2018-10-27 DIAGNOSIS — E118 Type 2 diabetes mellitus with unspecified complications: Secondary | ICD-10-CM | POA: Diagnosis not present

## 2018-10-27 DIAGNOSIS — D126 Benign neoplasm of colon, unspecified: Secondary | ICD-10-CM | POA: Diagnosis not present

## 2018-10-27 DIAGNOSIS — Z23 Encounter for immunization: Secondary | ICD-10-CM

## 2018-10-27 DIAGNOSIS — I152 Hypertension secondary to endocrine disorders: Secondary | ICD-10-CM

## 2018-10-27 DIAGNOSIS — E1169 Type 2 diabetes mellitus with other specified complication: Secondary | ICD-10-CM

## 2018-10-27 DIAGNOSIS — E785 Hyperlipidemia, unspecified: Secondary | ICD-10-CM

## 2018-10-27 DIAGNOSIS — I10 Essential (primary) hypertension: Secondary | ICD-10-CM

## 2018-10-27 LAB — POCT GLYCOSYLATED HEMOGLOBIN (HGB A1C): Hemoglobin A1C: 7.1 % — AB (ref 4.0–5.6)

## 2018-10-27 NOTE — Progress Notes (Signed)
  Subjective:    Patient ID: [name], male    DOB: 1950/12/12, 68 y.o.   MRN: [id number]  [name] is a 68 y.o. male who presents for follow-up of Type 2 diabetes mellitus.  Patient is  checking home blood sugars.   Home blood sugar records: meter record  How often is blood sugars being checked: once a week 77-170 fasting and post meal   Current symptoms/problems include none at this time. Daily foot checks: yes   Any foot concerns: none Last eye exam: 09/2018 Exercise: fishing , walking  He continues on atorvastatin, enalapril/HCTZ as well as Jentadueto and is having no difficulty with these medications.  His eating habits have gotten slightly better and he has lost some weight because of this.  He did have a recent colonoscopy which did show an adenomatous polyp and scheduled follow-up in 5 years. The following portions of the patient's history were reviewed and updated as appropriate: allergies, current medications, past medical history, past social history and problem list.  ROS as in subjective above.     Objective:    Physical Exam Alert and in no distress otherwise not examined. Hemoglobin A1c is 7.1  Lab Review Diabetic Labs Latest Ref Rng & Units 10/27/2018 04/27/2018 12/21/2017 08/05/2017 03/13/2017  HbA1c 4.0 - 5.6 % 7.1(A) 8.0(A) 7.2(A) 7.1(A) 7.7  Microalbumin mg/L - - 8.9 - -  Micro/Creat Ratio - - - 5.4 - -  Chol 100 - 199 mg/dL - - 143 - -  HDL >39 mg/dL - - 38(L) - -  Calc LDL 0 - 99 mg/dL - - 83 - -  Triglycerides 0 - 149 mg/dL - - 109 - -  Creatinine 0.76 - 1.27 mg/dL - - 1.33(H) - -   BP/Weight 10/27/2018 07/16/2018 06/02/2018 04/27/2018 [id number]  Systolic BP [id number] - [id number]  Diastolic BP 82 74 - 86 86  Wt. (Lbs) 158.6 164 164 168.4 160.8  BMI 22.12 22.87 22.87 23.49 22.43   Foot/eye exam completion dates Latest Ref Rng & Units 04/27/2018 09/11/2017  Eye Exam No Retinopathy - No Retinopathy  Foot Form Completion - Done -    [name]  reports  that he quit smoking about 38 years ago. His smoking use included cigarettes. He has never used smokeless tobacco. He reports current drug use. Drug: Marijuana. He reports that he does not drink alcohol.     Assessment & Plan:    Controlled type 2 diabetes mellitus with complication, without long-term current use of insulin ([place]) - Plan: POCT glycosylated hemoglobin (Hb A1C)  Adenomatous polyp of colon, unspecified part of colon  Hyperlipidemia associated with type 2 diabetes mellitus ([place])  Hypertension associated with diabetes ([place])  Need for influenza vaccination - Plan: Flu Vaccine QUAD High Dose(Fluad)    1. Rx changes: none 2. Education: Reviewed 'ABCs' of diabetes management (respective goals in parentheses):  A1C (<7), blood pressure (<130/80), and cholesterol (LDL <100). 3. Compliance at present is estimated to be good. Efforts to improve compliance (if necessary) will be directed at No change. 4. Follow up: 4 months

## 2018-10-28 ENCOUNTER — Ambulatory Visit: Payer: Medicare Other | Admitting: Family Medicine

## 2018-12-08 ENCOUNTER — Telehealth: Payer: Self-pay | Admitting: Family Medicine

## 2018-12-08 MED ORDER — ATORVASTATIN CALCIUM 40 MG PO TABS: 40.0000 mg | ORAL_TABLET | Freq: Every day | ORAL | 0 refills | Status: DC

## 2018-12-08 NOTE — Telephone Encounter (Signed)
[facility] pt's wife called and states that pt is almost out of atorvastatin. Will need to send a 30 day supply to local pharmacy, [place].  He will also need 90 day supply to [facility].  Wife can be reached at [phone].

## 2018-12-08 NOTE — Telephone Encounter (Signed)
Sent in med to local and [facility]

## 2018-12-30 ENCOUNTER — Other Ambulatory Visit: Payer: Self-pay | Admitting: Family Medicine

## 2019-01-25 ENCOUNTER — Other Ambulatory Visit: Payer: Self-pay

## 2019-01-25 ENCOUNTER — Telehealth: Payer: Self-pay | Admitting: Family Medicine

## 2019-01-25 MED ORDER — JENTADUETO XR 5-1000 MG PO TB24: 1.0000 | ORAL_TABLET | Freq: Every day | ORAL | 3 refills | Status: DC

## 2019-01-25 NOTE — Telephone Encounter (Signed)
Pt wife called and is requesting a refill on jentadueto XR pt would like it sent to the [place], [place]

## 2019-02-16 ENCOUNTER — Other Ambulatory Visit: Payer: Self-pay | Admitting: Family Medicine

## 2019-04-26 ENCOUNTER — Other Ambulatory Visit: Payer: Self-pay

## 2019-04-26 ENCOUNTER — Ambulatory Visit (INDEPENDENT_AMBULATORY_CARE_PROVIDER_SITE_OTHER): Payer: Medicare Other | Admitting: Family Medicine

## 2019-04-26 ENCOUNTER — Encounter: Payer: Self-pay | Admitting: Family Medicine

## 2019-04-26 VITALS — BP 142/80 | HR 72 | Temp 96.6°F | Wt 165.0 lb

## 2019-04-26 DIAGNOSIS — I10 Essential (primary) hypertension: Secondary | ICD-10-CM

## 2019-04-26 DIAGNOSIS — E785 Hyperlipidemia, unspecified: Secondary | ICD-10-CM

## 2019-04-26 DIAGNOSIS — E118 Type 2 diabetes mellitus with unspecified complications: Secondary | ICD-10-CM | POA: Diagnosis not present

## 2019-04-26 DIAGNOSIS — E1159 Type 2 diabetes mellitus with other circulatory complications: Secondary | ICD-10-CM | POA: Diagnosis not present

## 2019-04-26 DIAGNOSIS — E1169 Type 2 diabetes mellitus with other specified complication: Secondary | ICD-10-CM | POA: Diagnosis not present

## 2019-04-26 DIAGNOSIS — D126 Benign neoplasm of colon, unspecified: Secondary | ICD-10-CM | POA: Diagnosis not present

## 2019-04-26 DIAGNOSIS — Z87891 Personal history of nicotine dependence: Secondary | ICD-10-CM | POA: Diagnosis not present

## 2019-04-26 DIAGNOSIS — I152 Hypertension secondary to endocrine disorders: Secondary | ICD-10-CM

## 2019-04-26 LAB — POCT UA - MICROALBUMIN
Albumin/Creatinine Ratio, Urine, POC: 5.6
Creatinine, POC: 207.5 mg/dL
Microalbumin Ur, POC: 11.6 mg/L

## 2019-04-26 LAB — POCT GLYCOSYLATED HEMOGLOBIN (HGB A1C): Hemoglobin A1C: 8.3 % — AB (ref 4.0–5.6)

## 2019-04-26 MED ORDER — ENALAPRIL-HYDROCHLOROTHIAZIDE 5-12.5 MG PO TABS: 1.0000 | ORAL_TABLET | Freq: Every day | ORAL | 3 refills | Status: DC

## 2019-04-26 MED ORDER — ATORVASTATIN CALCIUM 40 MG PO TABS: 40.0000 mg | ORAL_TABLET | Freq: Every day | ORAL | 3 refills | Status: DC

## 2019-04-26 MED ORDER — JENTADUETO XR 5-1000 MG PO TB24: 1.0000 | ORAL_TABLET | Freq: Every day | ORAL | 1 refills | Status: DC

## 2019-04-26 NOTE — Progress Notes (Signed)
  Subjective:    Patient ID: [name], male    DOB: Dec 11, 1950, 69 y.o.   MRN: [id number]  [name] is a 69 y.o. male who presents for follow-up of Type 2 diabetes mellitus.  Home blood sugar records: 130-180 Current symptoms/problems include none and have been unchanged. Daily foot checks:   Any foot concerns: No Exercise: Regular walking pattern Diet: The following portions of the patient's history were reviewed and updated as appropriate: allergies, current medications, past medical history, past social history and problem list.  ROS as in subjective above.     Objective:    Physical Exam Alert and in no distress otherwise not examined.  Blood pressure (!) 142/80, pulse 72, temperature (!) 96.6 F (35.9 C), weight 165 lb (74.8 kg), SpO2 99 %.  Lab Review Diabetic Labs Latest Ref Rng & Units 10/27/2018 04/27/2018 12/21/2017 08/05/2017 03/13/2017  HbA1c 4.0 - 5.6 % 7.1(A) 8.0(A) 7.2(A) 7.1(A) 7.7  Microalbumin mg/L - - 8.9 - -  Micro/Creat Ratio - - - 5.4 - -  Chol 100 - 199 mg/dL - - 143 - -  HDL >39 mg/dL - - 38(L) - -  Calc LDL 0 - 99 mg/dL - - 83 - -  Triglycerides 0 - 149 mg/dL - - 109 - -  Creatinine 0.76 - 1.27 mg/dL - - 1.33(H) - -   BP/Weight 04/26/2019 10/27/2018 07/16/2018 0000000 [id number]  Systolic BP [id number] [id number] - [id number]  Diastolic BP 80 82 74 - 86  Wt. (Lbs) 165 158.6 164 164 168.4  BMI 23.01 22.12 22.87 22.87 23.49   Foot/eye exam completion dates Latest Ref Rng & Units 04/27/2018 09/11/2017  Eye Exam No Retinopathy - No Retinopathy  Foot Form Completion - Done -  Hemoglobin A1c 8.3  [name]  reports that he quit smoking about 38 years ago. His smoking use included cigarettes. He has never used smokeless tobacco. He reports current drug use. Drug: Marijuana. He reports that he does not drink alcohol.     Assessment & Plan:    Hypertension associated with diabetes ([place]) - Plan: CBC with Differential/Platelet, Comprehensive metabolic panel,  Enalapril-hydroCHLOROthiazide 5-12.5 MG tablet  Hyperlipidemia associated with type 2 diabetes mellitus ([place]) - Plan: Lipid panel, atorvastatin (LIPITOR) 40 MG tablet  Former smoker, stopped smoking in distant past  Controlled type 2 diabetes mellitus with complication, without long-term current use of insulin ([place]) - Plan: POCT glycosylated hemoglobin (Hb A1C), POCT UA - Microalbumin, CBC with Differential/Platelet, Comprehensive metabolic panel, Lipid panel, linaGLIPtin-metFORMIN HCl ER (JENTADUETO XR) 06-998 MG TB24  Adenomatous polyp of colon, unspecified part of colon    1. Rx changes: none 2. Education: Reviewed 'ABCs' of diabetes management (respective goals in parentheses):  A1C (<7), blood pressure (<130/80), and cholesterol (LDL <100). 3. Compliance at present is estimated to be good. Efforts to improve compliance (if necessary) will be directed at Monitor two hour postprandial blood sugars. 4. Follow up: 4 months In general he takes good care of himself so no good reason why his A1c would go up.  Discussed the need for him to monitor diet and exercise as well as blood sugars.  If he still has an elevated A1c with his next visit, I will readjust his medications.  He was comfortable with that.

## 2019-04-27 ENCOUNTER — Telehealth: Payer: Self-pay | Admitting: Family Medicine

## 2019-04-27 LAB — CBC WITH DIFFERENTIAL/PLATELET
Basophils Absolute: 0 10*3/uL (ref 0.0–0.2)
Basos: 0 %
EOS (ABSOLUTE): 0.7 10*3/uL — ABNORMAL HIGH (ref 0.0–0.4)
Eos: 7 %
Hematocrit: 41.8 % (ref 37.5–51.0)
Hemoglobin: 13.9 g/dL (ref 13.0–17.7)
Immature Grans (Abs): 0.1 10*3/uL (ref 0.0–0.1)
Immature Granulocytes: 1 %
Lymphocytes Absolute: 3 10*3/uL (ref 0.7–3.1)
Lymphs: 31 %
MCH: 29.4 pg (ref 26.6–33.0)
MCHC: 33.3 g/dL (ref 31.5–35.7)
MCV: 88 fL (ref 79–97)
Monocytes Absolute: 0.8 10*3/uL (ref 0.1–0.9)
Monocytes: 8 %
Neutrophils Absolute: 5.2 10*3/uL (ref 1.4–7.0)
Neutrophils: 53 %
Platelets: 245 10*3/uL (ref 150–450)
RBC: 4.73 x10E6/uL (ref 4.14–5.80)
RDW: 13.2 % (ref 11.6–15.4)
WBC: 9.7 10*3/uL (ref 3.4–10.8)

## 2019-04-27 LAB — LIPID PANEL
Chol/HDL Ratio: 3.4 ratio (ref 0.0–5.0)
Cholesterol, Total: 113 mg/dL (ref 100–199)
HDL: 33 mg/dL — ABNORMAL LOW (ref >39)
LDL Chol Calc (NIH): 59 mg/dL (ref 0–99)
Triglycerides: 113 mg/dL (ref 0–149)
VLDL Cholesterol Cal: 21 mg/dL (ref 5–40)

## 2019-04-27 LAB — COMPREHENSIVE METABOLIC PANEL
ALT: 19 IU/L (ref 0–44)
AST: 19 IU/L (ref 0–40)
Albumin/Globulin Ratio: 1.6 (ref 1.2–2.2)
Albumin: 4.2 g/dL (ref 3.8–4.8)
Alkaline Phosphatase: 74 IU/L (ref 39–117)
BUN/Creatinine Ratio: 15 (ref 10–24)
BUN: 22 mg/dL (ref 8–27)
Bilirubin Total: 0.2 mg/dL (ref 0.0–1.2)
CO2: 25 mmol/L (ref 20–29)
Calcium: 9.3 mg/dL (ref 8.6–10.2)
Chloride: 103 mmol/L (ref 96–106)
Creatinine, Ser: 1.46 mg/dL — ABNORMAL HIGH (ref 0.76–1.27)
GFR calc Af Amer: 57 mL/min/{1.73_m2} — ABNORMAL LOW (ref >59)
GFR calc non Af Amer: 49 mL/min/{1.73_m2} — ABNORMAL LOW (ref >59)
Globulin, Total: 2.6 g/dL (ref 1.5–4.5)
Glucose: 199 mg/dL — ABNORMAL HIGH (ref 65–99)
Potassium: 4.4 mmol/L (ref 3.5–5.2)
Sodium: 142 mmol/L (ref 134–144)
Total Protein: 6.8 g/dL (ref 6.0–8.5)

## 2019-04-27 NOTE — Telephone Encounter (Signed)
Lab information given

## 2019-08-02 ENCOUNTER — Telehealth: Payer: Self-pay

## 2019-08-02 NOTE — Telephone Encounter (Signed)
I called the pt. To check on his pt. Assistance for his Jentadueto XR and his wife said he already received it and he did not need any more samples now.

## 2019-08-05 LAB — HM DIABETES EYE EXAM

## 2019-08-30 ENCOUNTER — Other Ambulatory Visit: Payer: Self-pay

## 2019-08-30 ENCOUNTER — Ambulatory Visit (INDEPENDENT_AMBULATORY_CARE_PROVIDER_SITE_OTHER): Payer: Medicare Other | Admitting: Family Medicine

## 2019-08-30 VITALS — BP 112/76 | HR 96 | Temp 98.4°F | Wt 153.2 lb

## 2019-08-30 DIAGNOSIS — Z800 Family history of malignant neoplasm of digestive organs: Secondary | ICD-10-CM

## 2019-08-30 DIAGNOSIS — E1159 Type 2 diabetes mellitus with other circulatory complications: Secondary | ICD-10-CM | POA: Diagnosis not present

## 2019-08-30 DIAGNOSIS — D126 Benign neoplasm of colon, unspecified: Secondary | ICD-10-CM | POA: Diagnosis not present

## 2019-08-30 DIAGNOSIS — E785 Hyperlipidemia, unspecified: Secondary | ICD-10-CM

## 2019-08-30 DIAGNOSIS — I10 Essential (primary) hypertension: Secondary | ICD-10-CM

## 2019-08-30 DIAGNOSIS — E1169 Type 2 diabetes mellitus with other specified complication: Secondary | ICD-10-CM | POA: Diagnosis not present

## 2019-08-30 DIAGNOSIS — I152 Hypertension secondary to endocrine disorders: Secondary | ICD-10-CM

## 2019-08-30 DIAGNOSIS — E118 Type 2 diabetes mellitus with unspecified complications: Secondary | ICD-10-CM

## 2019-08-30 LAB — POCT GLYCOSYLATED HEMOGLOBIN (HGB A1C): Hemoglobin A1C: 7.5 % — AB (ref 4.0–5.6)

## 2019-08-30 NOTE — Progress Notes (Signed)
°  Subjective:    Patient ID: [name], male    DOB: August 27, 1950, 69 y.o.   MRN: [id number]  [name] is a 69 y.o. male who presents for follow-up of Type 2 diabetes mellitus.  Home blood sugar records: meter records, fasting, 117-170 Current symptoms/problems include none at this time. Daily foot checks:yes   Any foot concerns: none Exercise: walking yard work Diet: fair He continues on [name] and states he is doing a better job with his diet.  Lipitor is causing no difficulty.  Also he is taking enalapril/HCTZ without any problems.  Does have a history of colonic polyps and is scheduled for follow-up in several years.  He states that his brother has colon cancer. The following portions of the patient's history were reviewed and updated as appropriate: allergies, current medications, past medical history, past social history and problem list.  ROS as in subjective above.     Objective:    Physical Exam Alert and in no distress otherwise not examined.  Hemoglobin A1c is 7.5  Lab Review Diabetic Labs Latest Ref Rng & Units 04/26/2019 10/27/2018 04/27/2018 12/21/2017 08/05/2017  HbA1c 4.0 - 5.6 % 8.3(A) 7.1(A) 8.0(A) 7.2(A) 7.1(A)  Microalbumin mg/L 11.6 - - 8.9 -  Micro/Creat Ratio - 5.6 - - 5.4 -  Chol 100 - 199 mg/dL 113 - - 143 -  HDL >39 mg/dL 33(L) - - 38(L) -  Calc LDL 0 - 99 mg/dL 59 - - 83 -  Triglycerides 0 - 149 mg/dL 113 - - 109 -  Creatinine 0.76 - 1.27 mg/dL 1.46(H) - - 1.33(H) -   BP/Weight 08/30/2019 04/26/2019 10/27/2018 03/22/5168 0/02/7492  Systolic BP 496 759 163 846 -  Diastolic BP 76 80 82 74 -  Wt. (Lbs) 153.2 165 158.6 164 164  BMI 21.37 23.01 22.12 22.87 22.87   Foot/eye exam completion dates Latest Ref Rng & Units 08/05/2019 04/26/2019  Eye Exam No Retinopathy No Retinopathy -  Foot Form Completion - - Done    [name]  reports that he quit smoking about 39 years ago. His smoking use included cigarettes. He has never used smokeless tobacco. He  reports current drug use. Drug: Marijuana. He reports that he does not drink alcohol.     Assessment & Plan:    Controlled type 2 diabetes mellitus with complication, without long-term current use of insulin ([name])  Hypertension associated with diabetes ([name])  Hyperlipidemia associated with type 2 diabetes mellitus ([name])  Adenomatous polyp of colon, unspecified part of colon  Family history of colon cancer   1. Rx changes: none 2. Education: Reviewed ABCs of diabetes management (respective goals in parentheses):  A1C (<7), blood pressure (<130/80), and cholesterol (LDL <100). 3. Compliance at present is estimated to be good. Efforts to improve compliance (if necessary) will be directed at Continue with present diet, exercise and medications. 4. Follow up: 4 months

## 2019-08-30 NOTE — Addendum Note (Signed)
Addended by: Elyse Jarvis on: 08/30/2019 10:04 AM   Modules accepted: Orders

## 2019-09-01 ENCOUNTER — Encounter: Payer: Self-pay | Admitting: Family Medicine

## 2019-09-06 ENCOUNTER — Encounter: Payer: Self-pay | Admitting: Family Medicine

## 2019-12-01 ENCOUNTER — Ambulatory Visit: Payer: Medicare Other | Admitting: Family Medicine

## 2019-12-01 NOTE — Progress Notes (Deleted)
  Subjective:    Patient ID: [name], male    DOB: 1950-09-06, 69 y.o.   MRN: [id number]  BUEL MOLDER is a 69 y.o. male who presents for follow-up of Type 2 diabetes mellitus.  Home blood sugar records: {diabetes glucometry results:16657} Current symptoms/problems include {Symptoms; diabetes:14075} and have been {Desc; course:15616}. Daily foot checks:   Any foot concerns: *** Exercise: {types:19826} Diet: The following portions of the patient's history were reviewed and updated as appropriate: allergies, current medications, past medical history, past social history and problem list.  ROS as in subjective above.     Objective:    Physical Exam Alert and in no distress otherwise not examined.  There were no vitals taken for this visit.  Lab Review Diabetic Labs Latest Ref Rng & Units 08/30/2019 04/26/2019 10/27/2018 04/27/2018 12/21/2017  HbA1c 4.0 - 5.6 % 7.5(A) 8.3(A) 7.1(A) 8.0(A) 7.2(A)  Microalbumin mg/L - 11.6 - - 8.9  Micro/Creat Ratio - - 5.6 - - 5.4  Chol 100 - 199 mg/dL - 113 - - 143  HDL >39 mg/dL - 33(L) - - 38(L)  Calc LDL 0 - 99 mg/dL - 59 - - 83  Triglycerides 0 - 149 mg/dL - 113 - - 109  Creatinine 0.76 - 1.27 mg/dL - 1.46(H) - - 1.33(H)   BP/Weight 08/30/2019 04/26/2019 10/27/2018 2/42/6834 03/04/6220  Systolic BP 979 892 119 417 -  Diastolic BP 76 80 82 74 -  Wt. (Lbs) 153.2 165 158.6 164 164  BMI 21.37 23.01 22.12 22.87 22.87   Foot/eye exam completion dates Latest Ref Rng & Units 08/05/2019 04/26/2019  Eye Exam No Retinopathy No Retinopathy -  Foot Form Completion - - Done    Davinder  reports that he quit smoking about 39 years ago. His smoking use included cigarettes. He has never used smokeless tobacco. He reports current drug use. Drug: Marijuana. He reports that he does not drink alcohol.     Assessment & Plan:    No diagnosis found.  1. Rx changes: {none:33079} 2. Education: Reviewed 'ABCs' of diabetes management (respective goals in  parentheses):  A1C (<7), blood pressure (<130/80), and cholesterol (LDL <100). 3. Compliance at present is estimated to be {good/fair/poor:33178}. Efforts to improve compliance (if necessary) will be directed at {compliance:16716}. 4. Follow up: {NUMBERS; 0-10:33138} {time:11}

## 2019-12-02 ENCOUNTER — Encounter: Payer: Self-pay | Admitting: Family Medicine

## 2019-12-14 ENCOUNTER — Encounter: Payer: Self-pay | Admitting: Family Medicine

## 2019-12-14 ENCOUNTER — Ambulatory Visit (INDEPENDENT_AMBULATORY_CARE_PROVIDER_SITE_OTHER): Payer: Medicare Other | Admitting: Family Medicine

## 2019-12-14 ENCOUNTER — Other Ambulatory Visit: Payer: Self-pay

## 2019-12-14 VITALS — BP 138/82 | HR 77 | Temp 97.7°F | Wt 155.9 lb

## 2019-12-14 DIAGNOSIS — E1159 Type 2 diabetes mellitus with other circulatory complications: Secondary | ICD-10-CM

## 2019-12-14 DIAGNOSIS — E1169 Type 2 diabetes mellitus with other specified complication: Secondary | ICD-10-CM | POA: Diagnosis not present

## 2019-12-14 DIAGNOSIS — E785 Hyperlipidemia, unspecified: Secondary | ICD-10-CM

## 2019-12-14 DIAGNOSIS — I152 Hypertension secondary to endocrine disorders: Secondary | ICD-10-CM

## 2019-12-14 DIAGNOSIS — N1831 Chronic kidney disease, stage 3a: Secondary | ICD-10-CM | POA: Insufficient documentation

## 2019-12-14 DIAGNOSIS — D126 Benign neoplasm of colon, unspecified: Secondary | ICD-10-CM | POA: Diagnosis not present

## 2019-12-14 DIAGNOSIS — E118 Type 2 diabetes mellitus with unspecified complications: Secondary | ICD-10-CM

## 2019-12-14 DIAGNOSIS — Z23 Encounter for immunization: Secondary | ICD-10-CM | POA: Diagnosis not present

## 2019-12-14 LAB — POCT GLYCOSYLATED HEMOGLOBIN (HGB A1C): Hemoglobin A1C: 7.3 % — AB (ref 4.0–5.6)

## 2019-12-14 NOTE — Progress Notes (Signed)
  Subjective:    Patient ID: [name], male    DOB: 19-Mar-1950, 69 y.o.   MRN: [id number]  [name] is a 69 y.o. male who presents for follow-up of Type 2 diabetes mellitus.  Home blood sugar records: meter records, fasting /130-180 Current symptoms/problems include none at this time. Daily foot checks:yes   Any foot concerns: none Exercise: walking and fishing  Diet: fair He continues on atorvastatin and having no aches or pains with that.  He is also taking enalapril/HCTZ with no trouble.  Continues on [place].  He does have laboratory evidence of CKD 3.  He also has a history of adenomatous colonic polyp and is scheduled for follow-up.  Otherwise he has no concerns or questions. The following portions of the patient's history were reviewed and updated as appropriate: allergies, current medications, past medical history, past social history and problem list.  ROS as in subjective above.     Objective:    Physical Exam Alert and in no distress otherwise not examined.  Blood pressure 138/82, pulse 77, temperature 97.7 F (36.5 C), weight 155 lb 14.4 oz (70.7 kg), SpO2 96 %.  Lab Review Diabetic Labs Latest Ref Rng & Units 12/14/2019 08/30/2019 04/26/2019 10/27/2018 04/27/2018  HbA1c 4.0 - 5.6 % 7.3(A) 7.5(A) 8.3(A) 7.1(A) 8.0(A)  Microalbumin mg/L - - 11.6 - -  Micro/Creat Ratio - - - 5.6 - -  Chol 100 - 199 mg/dL - - 113 - -  HDL >39 mg/dL - - 33(L) - -  Calc LDL 0 - 99 mg/dL - - 59 - -  Triglycerides 0 - 149 mg/dL - - 113 - -  Creatinine 0.76 - 1.27 mg/dL - - 1.46(H) - -   BP/Weight 12/14/2019 08/30/2019 04/26/2019 10/27/2018 3/79/0240  Systolic BP 973 532 992 426 834  Diastolic BP 82 76 80 82 74  Wt. (Lbs) 155.9 153.2 165 158.6 164  BMI 21.74 21.37 23.01 22.12 22.87   Foot/eye exam completion dates Latest Ref Rng & Units 08/05/2019 04/26/2019  Eye Exam No Retinopathy No Retinopathy -  Foot Form Completion - - Done  Hemoglobin A1c is 7.3  [name]  reports that he  quit smoking about 39 years ago. His smoking use included cigarettes. He has never used smokeless tobacco. He reports current drug use. Drug: Marijuana. He reports that he does not drink alcohol.     Assessment & Plan:    Controlled type 2 diabetes mellitus with complication, without long-term current use of insulin ([place]) - Plan: POCT glycosylated hemoglobin (Hb A1C)  Hypertension associated with diabetes ([name])  Hyperlipidemia associated with type 2 diabetes mellitus ([place])  Adenomatous polyp of colon, unspecified part of colon  Stage 3a chronic kidney disease ([place])  Need for viral immunization - Plan: Pfizer SARS-COV-2 Vaccine   1. Rx changes: none 2. Education: Reviewed 'ABCs' of diabetes management (respective goals in parentheses):  A1C (<7), blood pressure (<130/80), and cholesterol (LDL <100). 3. Compliance at present is estimated to be good. Efforts to improve compliance (if necessary) will be directed at No change. 4. Follow up: 6 months I did discuss the kidney disease with him.  Explained that at this point its watchful waiting.

## 2020-01-09 DIAGNOSIS — H40033 Anatomical narrow angle, bilateral: Secondary | ICD-10-CM | POA: Diagnosis not present

## 2020-01-09 DIAGNOSIS — E119 Type 2 diabetes mellitus without complications: Secondary | ICD-10-CM | POA: Diagnosis not present

## 2020-02-02 ENCOUNTER — Other Ambulatory Visit: Payer: Self-pay | Admitting: Family Medicine

## 2020-02-02 DIAGNOSIS — E785 Hyperlipidemia, unspecified: Secondary | ICD-10-CM

## 2020-02-02 DIAGNOSIS — E1169 Type 2 diabetes mellitus with other specified complication: Secondary | ICD-10-CM

## 2020-02-22 ENCOUNTER — Telehealth: Payer: Self-pay

## 2020-02-22 NOTE — Telephone Encounter (Signed)
PT ASSISTANCE JENTADUETO XR completed and application faxed

## 2020-03-01 NOTE — Telephone Encounter (Signed)
Wife called back and states she hasn't heard from Pt Assistance tried calling them and can't get thru.  Pt needs samples

## 2020-03-01 NOTE — Telephone Encounter (Signed)
Called pt to see if needs samples

## 2020-03-08 ENCOUNTER — Telehealth: Payer: Self-pay

## 2020-03-08 NOTE — Telephone Encounter (Signed)
Recv'd fax from [facility] need printed prescription for year supply of Jentadueto XR  So I can fax

## 2020-03-09 ENCOUNTER — Other Ambulatory Visit: Payer: Self-pay | Admitting: Medical

## 2020-03-09 DIAGNOSIS — E118 Type 2 diabetes mellitus with unspecified complications: Secondary | ICD-10-CM

## 2020-03-09 MED ORDER — JENTADUETO XR 5-1000 MG PO TB24: 1.0000 | ORAL_TABLET | Freq: Every day | ORAL | 3 refills | Status: DC

## 2020-03-09 NOTE — Telephone Encounter (Signed)
See if you can get [name] to sign for it otherwise I will do it Monday

## 2020-03-09 NOTE — Telephone Encounter (Signed)
Rx faxed to [facility] cares

## 2020-03-09 NOTE — Telephone Encounter (Signed)
Rx printed at nurse station

## 2020-03-21 NOTE — Telephone Encounter (Signed)
Recv'd fax that pt was approved til 02/23/21.  Let message for pt

## 2020-04-11 ENCOUNTER — Ambulatory Visit: Payer: Medicare Other | Admitting: Family Medicine

## 2020-04-17 ENCOUNTER — Other Ambulatory Visit: Payer: Self-pay

## 2020-04-17 ENCOUNTER — Telehealth: Payer: Self-pay

## 2020-04-17 ENCOUNTER — Encounter: Payer: Self-pay | Admitting: Family Medicine

## 2020-04-17 ENCOUNTER — Ambulatory Visit (INDEPENDENT_AMBULATORY_CARE_PROVIDER_SITE_OTHER): Payer: Medicare Other | Admitting: Family Medicine

## 2020-04-17 VITALS — BP 132/82 | HR 81 | Temp 97.2°F | Wt 157.2 lb

## 2020-04-17 DIAGNOSIS — E1159 Type 2 diabetes mellitus with other circulatory complications: Secondary | ICD-10-CM | POA: Diagnosis not present

## 2020-04-17 DIAGNOSIS — D126 Benign neoplasm of colon, unspecified: Secondary | ICD-10-CM

## 2020-04-17 DIAGNOSIS — E785 Hyperlipidemia, unspecified: Secondary | ICD-10-CM

## 2020-04-17 DIAGNOSIS — Z800 Family history of malignant neoplasm of digestive organs: Secondary | ICD-10-CM | POA: Diagnosis not present

## 2020-04-17 DIAGNOSIS — E1122 Type 2 diabetes mellitus with diabetic chronic kidney disease: Secondary | ICD-10-CM

## 2020-04-17 DIAGNOSIS — E1169 Type 2 diabetes mellitus with other specified complication: Secondary | ICD-10-CM

## 2020-04-17 DIAGNOSIS — N1831 Chronic kidney disease, stage 3a: Secondary | ICD-10-CM

## 2020-04-17 DIAGNOSIS — I152 Hypertension secondary to endocrine disorders: Secondary | ICD-10-CM | POA: Diagnosis not present

## 2020-04-17 DIAGNOSIS — E118 Type 2 diabetes mellitus with unspecified complications: Secondary | ICD-10-CM

## 2020-04-17 LAB — POCT UA - MICROALBUMIN: Microalbumin Ur, POC: 8.1 mg/L

## 2020-04-17 LAB — POCT GLYCOSYLATED HEMOGLOBIN (HGB A1C): Hemoglobin A1C: 8.3 % — AB (ref 4.0–5.6)

## 2020-04-17 MED ORDER — JENTADUETO XR 5-1000 MG PO TB24: 1.0000 | ORAL_TABLET | Freq: Every day | ORAL | 3 refills | Status: DC

## 2020-04-17 MED ORDER — ATORVASTATIN CALCIUM 40 MG PO TABS: 40.0000 mg | ORAL_TABLET | Freq: Every day | ORAL | 3 refills | Status: DC

## 2020-04-17 MED ORDER — ENALAPRIL-HYDROCHLOROTHIAZIDE 5-12.5 MG PO TABS: 1.0000 | ORAL_TABLET | Freq: Every day | ORAL | 3 refills | Status: DC

## 2020-04-17 MED ORDER — DAPAGLIFLOZIN PROPANEDIOL 5 MG PO TABS: 5.0000 mg | ORAL_TABLET | Freq: Every day | ORAL | 1 refills | Status: DC

## 2020-04-17 NOTE — Progress Notes (Signed)
[name] is a 70 y.o. male who presents for annual wellness visit and follow-up on chronic medical conditions.  He has no particular concerns or complaints.  He continues on his diabetes meds with no difficulty.  He is also taking Lipitor without aches or pains.  Enalapril/HCTZ has been taken.  Blood pressure.  He does have a history of colonic polyps and is scheduled for routine colonoscopy follow-up on that.  He does have evidence of stage III CKD which is being monitored.  Otherwise he has no particular concerns or complaints.   Immunizations and Health Maintenance Immunization History  Administered Date(s) Administered  . Fluad Quad(high Dose 65+) 10/27/2018  . Influenza Split 02/20/2011, 12/25/2012  . Influenza, High Dose Seasonal PF 12/21/2017  . Influenza,inj,Quad PF,6+ Mos 11/10/2013, 10/26/2015, 11/06/2016  . Influenza-Unspecified 11/24/2019  . PFIZER(Purple Top)SARS-COV-2 Vaccination 03/31/2019, 04/21/2019, 12/14/2019  . Pneumococcal Conjugate-13 11/10/2013  . Pneumococcal Polysaccharide-23 10/15/2010  . Tdap 02/25/2007, 07/25/2016  . Zoster 02/09/2012  . Zoster Recombinat (Shingrix) 07/25/2016, 11/06/2016   There are no preventive care reminders to display for this patient.  Last colonoscopy: 07/16/18 Last PSA: n/a Dentist: n/a Ophtho: q year Exercise: walking 3 times a week  Other doctors caring for patient include: Dr. Havery Moros [facility],   Advanced Directives: Does Patient Have a Medical Advance Directive?: No Would patient like information on creating a medical advance directive?: Yes (MAU/Ambulatory/Procedural Areas - Information given)  Depression screen:  See questionnaire below.     Depression screen [facility] 2/9 04/17/2020 08/30/2019 04/27/2018 12/21/2017 07/25/2016  Decreased Interest 0 0 0 0 0  Down, Depressed, Hopeless 0 0 0 0 0  PHQ - 2 Score 0 0 0 0 0    Fall Screen: See Questionaire below.   Fall Risk  04/17/2020 08/30/2019 04/27/2018 12/21/2017 07/25/2016  Falls  in the past year? 0 0 0 No No  Number falls in past yr: 0 - - - -  Injury with Fall? 0 - - - -  Risk for fall due to : No Fall Risks - - - -  Follow up Falls evaluation completed - - - -    ADL screen:  See questionnaire below.  Functional Status Survey: Is the patient deaf or have difficulty hearing?: No Does the patient have difficulty seeing, even when wearing glasses/contacts?: No Does the patient have difficulty concentrating, remembering, or making decisions?: No Does the patient have difficulty walking or climbing stairs?: No Does the patient have difficulty dressing or bathing?: No Does the patient have difficulty doing errands alone such as visiting a doctor's office or shopping?: No   Review of Systems  Constitutional: -, -unexpected weight change, -anorexia, -fatigue Allergy: -sneezing, -itching, -congestion Dermatology: denies changing moles, rash, lumps ENT: -runny nose, -ear pain, -sore throat,  Cardiology:  -chest pain, -palpitations, -orthopnea, Respiratory: -cough, -shortness of breath, -dyspnea on exertion, -wheezing,  Gastroenterology: -abdominal pain, -nausea, -vomiting, -diarrhea, -constipation, -dysphagia Hematology: -bleeding or bruising problems Musculoskeletal: -arthralgias, -myalgias, -joint swelling, -back pain, - Ophthalmology: -vision changes,  Urology: -dysuria, -difficulty urinating,  -urinary frequency, -urgency, incontinence Neurology: -, -numbness, , -memory loss, -falls, -dizziness    PHYSICAL EXAM: General Appearance: Alert, cooperative, no distress, appears stated age Head: Normocephalic, without obvious abnormality, atraumatic Eyes: PERRL, conjunctiva/corneas clear, EOM's intact, fundi benign Ears: Normal TM's and external ear canals Nose: Nares normal, mucosa normal, no drainage or sinus   tenderness Throat: Lips, mucosa, and tongue normal; teeth and gums normal Neck: Supple, no lymphadenopathy, thyroid:no enlargement/tenderness/nodules;  no carotid  bruit or JVD Lungs: Clear to auscultation bilaterally without wheezes, rales or ronchi; respirations unlabored Heart: Regular rate and rhythm, S1 and S2 normal, no murmur, rub or gallop Abdomen: Soft, non-tender, nondistended, normoactive bowel sounds, no masses, no hepatosplenomegaly Extremities: No clubbing, cyanosis or edema Pulses: 2+ and symmetric all extremities Skin: Skin color, texture, turgor normal, no rashes or lesions Lymph nodes: Cervical, supraclavicular, and axillary nodes normal Neurologic: CNII-XII intact, normal strength, sensation and gait; reflexes 2+ and symmetric throughout   Psych: Normal mood, affect, hygiene and grooming Hemoglobin A1c is 8.3 ASSESSMENT/PLAN: Type 2 diabetes mellitus with stage 3a chronic kidney disease, without long-term current use of insulin (HCC) - Plan: CBC with Differential/Platelet, Comprehensive metabolic panel, Lipid panel, POCT UA - Microalbumin, dapagliflozin propanediol (FARXIGA) 5 MG TABS tablet  Adenomatous polyp of colon, unspecified part of colon  Hypertension associated with diabetes ([place]) - Plan: CBC with Differential/Platelet, Comprehensive metabolic panel, Enalapril-hydroCHLOROthiazide 5-12.5 MG tablet  Hyperlipidemia associated with type 2 diabetes mellitus (HCC) - Plan: Lipid panel, atorvastatin (LIPITOR) 40 MG tablet  Family history of colon cancer  Controlled type 2 diabetes mellitus with complication, without long-term current use of insulin (HCC) - Plan: linaGLIPtin-metFORMIN HCl ER (JENTADUETO XR) 06-998 MG TB24  Will continue on his present medication regimen.  I will add Wilder Glade to his regimen.  Cautioned that if it was too expensive, he is to let me know and also check with his insurance to see which drug this category would be covered better. Immunization recommendations discussed.  Colonoscopy recommendations reviewed.   Medicare Attestation I have personally reviewed: The patient's medical and social  history Their use of alcohol, tobacco or illicit drugs Their current medications and supplements The patient's functional ability including ADLs,fall risks, home safety risks, cognitive, and hearing and visual impairment Diet and physical activities Evidence for depression or mood disorders  The patient's weight, height, and BMI have been recorded in the chart.  I have made referrals, counseling, and provided education to the patient based on review of the above and I have provided the patient with a written personalized care plan for preventive services.     Jill Alexanders, MD   04/17/2020

## 2020-04-17 NOTE — Telephone Encounter (Signed)
LVM for pt to continue the jentadueto and to add farxiga.Marland Kitchen Okay

## 2020-04-17 NOTE — Patient Instructions (Signed)
  [name] , Thank you for taking time to come for your Medicare Wellness Visit. I appreciate your ongoing commitment to your health goals. Please review the following plan we discussed and let me know if I can assist you in the future.   These are the goals we discussed: Goals   None     This is a list of the screening recommended for you and due dates:  Health Maintenance  Topic Date Due  . Complete foot exam   04/25/2020  . Hemoglobin A1C  06/13/2020  . Eye exam for diabetics  08/04/2020  . Colon Cancer Screening  07/15/2025  . Tetanus Vaccine  07/26/2026  . Flu Shot  Completed  . COVID-19 Vaccine  Completed  .  Hepatitis C: One time screening is recommended by Center for Disease Control  (CDC) for  adults born from [birth year] through 1964.   Completed  . Pneumonia vaccines  Discontinued

## 2020-04-18 LAB — CBC WITH DIFFERENTIAL/PLATELET
Basophils Absolute: 0.1 10*3/uL (ref 0.0–0.2)
Basos: 1 %
EOS (ABSOLUTE): 0.3 10*3/uL (ref 0.0–0.4)
Eos: 4 %
Hematocrit: 33.5 % — ABNORMAL LOW (ref 37.5–51.0)
Hemoglobin: 11.2 g/dL — ABNORMAL LOW (ref 13.0–17.7)
Immature Grans (Abs): 0 10*3/uL (ref 0.0–0.1)
Immature Granulocytes: 0 %
Lymphocytes Absolute: 2.4 10*3/uL (ref 0.7–3.1)
Lymphs: 30 %
MCH: 31.5 pg (ref 26.6–33.0)
MCHC: 33.4 g/dL (ref 31.5–35.7)
MCV: 94 fL (ref 79–97)
Monocytes Absolute: 0.7 10*3/uL (ref 0.1–0.9)
Monocytes: 9 %
Neutrophils Absolute: 4.7 10*3/uL (ref 1.4–7.0)
Neutrophils: 56 %
Platelets: 374 10*3/uL (ref 150–450)
RBC: 3.55 x10E6/uL — ABNORMAL LOW (ref 4.14–5.80)
RDW: 11.8 % (ref 11.6–15.4)
WBC: 8.2 10*3/uL (ref 3.4–10.8)

## 2020-04-18 LAB — LIPID PANEL
Chol/HDL Ratio: 3.1 ratio (ref 0.0–5.0)
Cholesterol, Total: 122 mg/dL (ref 100–199)
HDL: 40 mg/dL (ref >39)
LDL Chol Calc (NIH): 64 mg/dL (ref 0–99)
Triglycerides: 95 mg/dL (ref 0–149)
VLDL Cholesterol Cal: 18 mg/dL (ref 5–40)

## 2020-04-18 LAB — COMPREHENSIVE METABOLIC PANEL
ALT: 18 IU/L (ref 0–44)
AST: 16 IU/L (ref 0–40)
Albumin/Globulin Ratio: 1.7 (ref 1.2–2.2)
Albumin: 4.6 g/dL (ref 3.8–4.8)
Alkaline Phosphatase: 72 IU/L (ref 44–121)
BUN/Creatinine Ratio: 18 (ref 10–24)
BUN: 25 mg/dL (ref 8–27)
Bilirubin Total: 0.4 mg/dL (ref 0.0–1.2)
CO2: 21 mmol/L (ref 20–29)
Calcium: 9.7 mg/dL (ref 8.6–10.2)
Chloride: 103 mmol/L (ref 96–106)
Creatinine, Ser: 1.41 mg/dL — ABNORMAL HIGH (ref 0.76–1.27)
GFR calc Af Amer: 59 mL/min/{1.73_m2} — ABNORMAL LOW (ref >59)
GFR calc non Af Amer: 51 mL/min/{1.73_m2} — ABNORMAL LOW (ref >59)
Globulin, Total: 2.7 g/dL (ref 1.5–4.5)
Glucose: 155 mg/dL — ABNORMAL HIGH (ref 65–99)
Potassium: 4.6 mmol/L (ref 3.5–5.2)
Sodium: 142 mmol/L (ref 134–144)
Total Protein: 7.3 g/dL (ref 6.0–8.5)

## 2020-04-19 ENCOUNTER — Other Ambulatory Visit: Payer: Self-pay

## 2020-04-19 DIAGNOSIS — D582 Other hemoglobinopathies: Secondary | ICD-10-CM

## 2020-05-22 ENCOUNTER — Encounter: Payer: Self-pay | Admitting: Family Medicine

## 2020-07-17 ENCOUNTER — Other Ambulatory Visit: Payer: Self-pay

## 2020-07-17 ENCOUNTER — Other Ambulatory Visit: Payer: Medicare Other

## 2020-07-17 DIAGNOSIS — D582 Other hemoglobinopathies: Secondary | ICD-10-CM | POA: Diagnosis not present

## 2020-07-17 LAB — CBC WITH DIFFERENTIAL/PLATELET
Basophils Absolute: 0 10*3/uL (ref 0.0–0.2)
Basos: 0 %
EOS (ABSOLUTE): 0.7 10*3/uL — ABNORMAL HIGH (ref 0.0–0.4)
Eos: 6 %
Hematocrit: 43.2 % (ref 37.5–51.0)
Hemoglobin: 14.6 g/dL (ref 13.0–17.7)
Immature Grans (Abs): 0 10*3/uL (ref 0.0–0.1)
Immature Granulocytes: 0 %
Lymphocytes Absolute: 3.7 10*3/uL — ABNORMAL HIGH (ref 0.7–3.1)
Lymphs: 36 %
MCH: 29.6 pg (ref 26.6–33.0)
MCHC: 33.8 g/dL (ref 31.5–35.7)
MCV: 87 fL (ref 79–97)
Monocytes Absolute: 0.8 10*3/uL (ref 0.1–0.9)
Monocytes: 8 %
Neutrophils Absolute: 5.1 10*3/uL (ref 1.4–7.0)
Neutrophils: 50 %
Platelets: 287 10*3/uL (ref 150–450)
RBC: 4.94 x10E6/uL (ref 4.14–5.80)
RDW: 13.2 % (ref 11.6–15.4)
WBC: 10.3 10*3/uL (ref 3.4–10.8)

## 2020-07-30 ENCOUNTER — Other Ambulatory Visit: Payer: Self-pay

## 2020-07-30 NOTE — Telephone Encounter (Signed)
Pt wife called and needed to know follow up lab results. And she also wanted to know if there was cheaper to get farxiga from some where other than [facility]. Pr wife was advsied to check [facility] and we would go from there.

## 2020-09-07 DIAGNOSIS — E119 Type 2 diabetes mellitus without complications: Secondary | ICD-10-CM | POA: Diagnosis not present

## 2020-09-07 DIAGNOSIS — H40033 Anatomical narrow angle, bilateral: Secondary | ICD-10-CM | POA: Diagnosis not present

## 2020-09-30 ENCOUNTER — Other Ambulatory Visit: Payer: Self-pay | Admitting: Family Medicine

## 2020-09-30 DIAGNOSIS — N1831 Chronic kidney disease, stage 3a: Secondary | ICD-10-CM

## 2020-09-30 DIAGNOSIS — E1122 Type 2 diabetes mellitus with diabetic chronic kidney disease: Secondary | ICD-10-CM

## 2020-10-15 ENCOUNTER — Ambulatory Visit (INDEPENDENT_AMBULATORY_CARE_PROVIDER_SITE_OTHER): Payer: Medicare Other | Admitting: Family Medicine

## 2020-10-15 ENCOUNTER — Other Ambulatory Visit: Payer: Self-pay

## 2020-10-15 ENCOUNTER — Encounter: Payer: Self-pay | Admitting: Family Medicine

## 2020-10-15 VITALS — BP 120/74 | HR 72 | Temp 98.2°F | Wt 149.8 lb

## 2020-10-15 DIAGNOSIS — E1169 Type 2 diabetes mellitus with other specified complication: Secondary | ICD-10-CM | POA: Diagnosis not present

## 2020-10-15 DIAGNOSIS — E1159 Type 2 diabetes mellitus with other circulatory complications: Secondary | ICD-10-CM

## 2020-10-15 DIAGNOSIS — E785 Hyperlipidemia, unspecified: Secondary | ICD-10-CM | POA: Diagnosis not present

## 2020-10-15 DIAGNOSIS — I152 Hypertension secondary to endocrine disorders: Secondary | ICD-10-CM

## 2020-10-15 DIAGNOSIS — E118 Type 2 diabetes mellitus with unspecified complications: Secondary | ICD-10-CM

## 2020-10-15 LAB — POCT GLYCOSYLATED HEMOGLOBIN (HGB A1C): Hemoglobin A1C: 7.5 % — AB (ref 4.0–5.6)

## 2020-10-15 MED ORDER — DAPAGLIFLOZIN PROPANEDIOL 10 MG PO TABS: 10.0000 mg | ORAL_TABLET | Freq: Every day | ORAL | 1 refills | Status: DC

## 2020-10-15 NOTE — Addendum Note (Signed)
Addended by: Linus Salmons, Maddy Graham D on: 10/15/2020 11:04 AM   Modules accepted: Orders

## 2020-10-15 NOTE — Progress Notes (Signed)
  Subjective:    Patient ID: [name], male    DOB: [birth year], 70 y.o.   MRN: [id number]  [name] is a 70 y.o. male who presents for follow-up of Type 2 diabetes mellitus.  Home blood sugar records:  He checks weekly Current symptoms/problems include none and have been unchanged. Daily foot checks:   Any foot concerns: None Exercise: Yard work Diet: Regular he has made some changes in his diet to try and help with his diabetes and has lost some weight because of that.  He also has some difficulty recently with nocturia x4 but has been drinking a lot of fluids especially 16 ounces before he goes to bed.  No hesitancy, decreased stream or incomplete emptying.  He is having no difficulty with his [place].  Continues on Lipitor and enalapril/HCTZ without trouble. The following portions of the patient's history were reviewed and updated as appropriate: allergies, current medications, past medical history, past social history and problem list.  ROS as in subjective above.     Objective:    Physical Exam Alert and in no distress otherwise not examined.  Blood pressure 120/74, pulse 72, temperature 98.2 F (36.8 C), weight 149 lb 12.8 oz (67.9 kg).  Lab Review Diabetic Labs Latest Ref Rng & Units 10/15/2020 04/17/2020 12/14/2019 08/30/2019 04/26/2019  HbA1c 4.0 - 5.6 % 7.5(A) 8.3(A) 7.3(A) 7.5(A) 8.3(A)  Microalbumin mg/L - 8.1 - - 11.6  Micro/Creat Ratio - - - - - 5.6  Chol 100 - 199 mg/dL - 122 - - 113  HDL >39 mg/dL - 40 - - 33(L)  Calc LDL 0 - 99 mg/dL - 64 - - 59  Triglycerides 0 - 149 mg/dL - 95 - - 113  Creatinine 0.76 - 1.27 mg/dL - 1.41(H) - - 1.46(H)   BP/Weight 10/15/2020 04/17/2020 12/14/2019 [id number]  Systolic BP 123456 [id number] [id number]  Diastolic BP 74 82 82 76 80  Wt. (Lbs) 149.8 157.2 155.9 153.2 165  BMI 20.89 21.92 21.74 21.37 23.01   Foot/eye exam completion dates Latest Ref Rng & Units 04/17/2020 08/05/2019  Eye Exam No Retinopathy - No Retinopathy   Foot Form Completion - Done -  Hemoglobin A1c is 7.5  [name]  reports that he quit smoking about 40 years ago. His smoking use included cigarettes. He has never used smokeless tobacco. He reports current drug use. Drug: Marijuana. He reports that he does not drink alcohol.     Assessment & Plan:    Controlled diabetes mellitus type 2 with complications, unspecified whether long term insulin use (HCC) - Plan: HgB A1c, dapagliflozin propanediol (FARXIGA) 10 MG TABS tablet  Hypertension associated with diabetes ([place])  Hyperlipidemia associated with type 2 diabetes mellitus (HCC)  Rx changes: Increase Farxiga to 10 mg. Education: Reviewed 'ABCs' of diabetes management (respective goals in parentheses):  A1C (<7), blood pressure (<130/80), and cholesterol (LDL <100). Compliance at present is estimated to be good. Efforts to improve compliance (if necessary) will be directed at  suggested that he does not need to refrain from eating as he has lost some weight and I would like to have a more stable with a higher weight. . Follow up: 4 months

## 2020-10-15 NOTE — Patient Instructions (Signed)
Drink plenty of fluids during the day but within a couple of hours of bedtime cut back on fluids and make sure you empty your bladder before you go to bed  Double the Farxiga until you run out and then start taking the new pill which will be a 10 mg pill

## 2020-11-05 ENCOUNTER — Telehealth: Payer: Self-pay | Admitting: Family Medicine

## 2020-11-05 NOTE — Telephone Encounter (Signed)
Pt's wife called to see if we have Farxiga samples  Please call

## 2020-11-06 NOTE — Telephone Encounter (Signed)
Samples are up front and wife was advised [facility]

## 2020-11-28 ENCOUNTER — Ambulatory Visit (INDEPENDENT_AMBULATORY_CARE_PROVIDER_SITE_OTHER): Payer: Medicare Other

## 2020-11-28 DIAGNOSIS — Z23 Encounter for immunization: Secondary | ICD-10-CM | POA: Diagnosis not present

## 2020-12-17 ENCOUNTER — Telehealth: Payer: Self-pay

## 2020-12-17 MED ORDER — DAPAGLIFLOZIN PROPANEDIOL 10 MG PO TABS: 10.0000 mg | ORAL_TABLET | Freq: Every day | ORAL | 3 refills | Status: DC

## 2020-12-17 NOTE — Telephone Encounter (Signed)
Called [place] [employer] [place] t# [phone] spent over an hour on hold, pt was approved 11/29/20 and since has Medicare will auto enroll for 2023, escribed Rx to [facility].  We do have to call back and place shipment once Rx is received

## 2020-12-24 NOTE — Telephone Encounter (Signed)
[place] called and stated everything is ready and approved and getting ready to be shipped

## 2020-12-27 ENCOUNTER — Telehealth: Payer: Self-pay

## 2020-12-27 NOTE — Telephone Encounter (Signed)
Pt. Wife called wanting to know if he could get some samples of Jentadueto because his come through the mail and they are not coming until next week. He has only three pills left. I looked in the sample closet the only mg we have of that medication is 2.06/998 and the pt. Is on 06/998 and she doesn't want him to run out. She would like to know if he could have the 2.06/998 mg samples since that's all we have.

## 2021-01-03 NOTE — Telephone Encounter (Signed)
Wife called and Wilder Glade still not received, 1 pack of samples given to hold pt

## 2021-02-04 ENCOUNTER — Telehealth: Payer: Self-pay

## 2021-02-04 NOTE — Telephone Encounter (Signed)
PT ASSISTANCE JENTADUETO XR renewal completed

## 2021-02-15 ENCOUNTER — Ambulatory Visit: Payer: Medicare Other | Admitting: Family Medicine

## 2021-02-20 ENCOUNTER — Other Ambulatory Visit: Payer: Self-pay

## 2021-02-20 ENCOUNTER — Encounter: Payer: Self-pay | Admitting: Family Medicine

## 2021-02-20 ENCOUNTER — Ambulatory Visit (INDEPENDENT_AMBULATORY_CARE_PROVIDER_SITE_OTHER): Payer: Medicare Other | Admitting: Family Medicine

## 2021-02-20 VITALS — BP 122/80 | HR 68 | Temp 98.3°F | Wt 149.4 lb

## 2021-02-20 DIAGNOSIS — N1831 Chronic kidney disease, stage 3a: Secondary | ICD-10-CM | POA: Diagnosis not present

## 2021-02-20 DIAGNOSIS — E1129 Type 2 diabetes mellitus with other diabetic kidney complication: Secondary | ICD-10-CM

## 2021-02-20 DIAGNOSIS — E1169 Type 2 diabetes mellitus with other specified complication: Secondary | ICD-10-CM

## 2021-02-20 DIAGNOSIS — E785 Hyperlipidemia, unspecified: Secondary | ICD-10-CM

## 2021-02-20 DIAGNOSIS — I152 Hypertension secondary to endocrine disorders: Secondary | ICD-10-CM

## 2021-02-20 DIAGNOSIS — E1159 Type 2 diabetes mellitus with other circulatory complications: Secondary | ICD-10-CM

## 2021-02-20 DIAGNOSIS — R809 Proteinuria, unspecified: Secondary | ICD-10-CM | POA: Diagnosis not present

## 2021-02-20 LAB — POCT GLYCOSYLATED HEMOGLOBIN (HGB A1C): Hemoglobin A1C: 8.2 % — AB (ref 4.0–5.6)

## 2021-02-20 MED ORDER — JENTADUETO 2.5-1000 MG PO TABS: 2.0000 | ORAL_TABLET | Freq: Every day | ORAL | 1 refills | Status: DC

## 2021-02-20 NOTE — Addendum Note (Signed)
Addended by: Linus Salmons, Alfretta Pinch D on: 02/20/2021 04:52 PM   Modules accepted: Orders

## 2021-02-20 NOTE — Progress Notes (Addendum)
Subjective:  Make me the author  Patient ID: [name], male    DOB: [birth year], 70 y.o.   MRN: [id number]  [name] is a 70 y.o. male who presents for follow-up of Type 2 diabetes mellitus.  Patient is checking home blood sugars.   Home blood sugar records: patient does not check sugars How often is blood sugars being checked: checked it about 3 months ago and it was 170 Current symptoms/problems include none and have been stable. Daily foot checks: Yes   Any foot concerns: spot on lt. foot Last eye exam: had one this year already  Exercise: Home exercise routine includes walking 4 hrs per day. Discussion on his diet indicates that he is eating a lot of carbohydrates.  Specifically grits He is taking Jentadueto as well as Farxiga at 10 mg.  Continues on Lipitor 40 mg and enalapril/HCTZ. The following portions of the patient's history were reviewed and updated as appropriate: allergies, current medications, past medical history, past social history and problem list.  ROS as in subjective above.     Objective:    Physical Exam Alert and in no distress otherwise not examined. Hemoglobin A1c is 8.2 Lab Review Diabetic Labs Latest Ref Rng & Units 10/15/2020 04/17/2020 12/14/2019 08/30/2019 04/26/2019  HbA1c 4.0 - 5.6 % 7.5(A) 8.3(A) 7.3(A) 7.5(A) 8.3(A)  Microalbumin mg/L - 8.1 - - 11.6  Micro/Creat Ratio - - - - - 5.6  Chol 100 - 199 mg/dL - 122 - - 113  HDL >39 mg/dL - 40 - - 33(L)  Calc LDL 0 - 99 mg/dL - 64 - - 59  Triglycerides 0 - 149 mg/dL - 95 - - 113  Creatinine 0.76 - 1.27 mg/dL - 1.41(H) - - 1.46(H)   BP/Weight 02/20/2021 10/15/2020 04/17/2020 16/02/930 04/29/5730  Systolic BP 202 542 706 237 628  Diastolic BP 80 74 82 82 76  Wt. (Lbs) 149.4 149.8 157.2 155.9 153.2  BMI 20.84 20.89 21.92 21.74 21.37   Foot/eye exam completion dates Latest Ref Rng & Units 04/17/2020 08/05/2019  Eye Exam No Retinopathy - No Retinopathy  Foot Form Completion - Done -     [place]  reports that he quit smoking about 40 years ago. His smoking use included cigarettes. He has never used smokeless tobacco. He reports current drug use. Drug: Marijuana. He reports that he does not drink alcohol.     Assessment & Plan:    Controlled type 2 diabetes mellitus with microalbuminuria, without long-term current use of insulin (HCC)  Hypertension associated with diabetes ([place])  Hyperlipidemia associated with type 2 diabetes mellitus ([place])  Stage 3a chronic kidney disease ([place])  Rx changes: He is to finish his present dosing of Jentadueto rather than throwing it away.  I put a future order in to switch to the 2.5/thousand 2/day.  He is to return here in roughly 6 months to reevaluate all this. Education: Reviewed ABCs of diabetes management (respective goals in parentheses):  A1C (<7), blood pressure (<130/80), and cholesterol (LDL <100). Compliance at present is estimated to be poor. Efforts to improve compliance (if necessary) will be directed at dietary modifications: Strongly encouraged him to cut back on his carbohydrates.  Recommend he check out the American diabetes Association website to look at how to cut back on carbohydrates.  Also encouraged him to exercise at least a half an hour every day. . Follow up: 6 months I will wait 6 months since he has about a 70-month supply of the  lower strength Jentadueto. Patient was seen and examined by me.

## 2021-02-20 NOTE — Patient Instructions (Signed)
Go to the American diabetes Association website and look at foods especially carbohydrates. Half an hour of something physical every day Call me when you are about to run out of that medicine and I will call in a new prescription

## 2021-03-10 ENCOUNTER — Other Ambulatory Visit: Payer: Self-pay | Admitting: Family Medicine

## 2021-03-10 DIAGNOSIS — E1159 Type 2 diabetes mellitus with other circulatory complications: Secondary | ICD-10-CM

## 2021-03-15 NOTE — Telephone Encounter (Signed)
PT Assistance Jentudeuto approved til 02/23/22

## 2021-03-18 ENCOUNTER — Telehealth: Payer: Self-pay

## 2021-03-18 NOTE — Telephone Encounter (Signed)
Left message that Pt [name] approved til end of year

## 2021-03-19 ENCOUNTER — Ambulatory Visit: Payer: Medicare Other | Admitting: Family Medicine

## 2021-03-26 ENCOUNTER — Telehealth: Payer: Self-pay | Admitting: Family Medicine

## 2021-03-26 NOTE — Telephone Encounter (Signed)
Spoke to spouse to schedule Medicare Annual Wellness Visit (AWV) either virtually or in office.  She stated insurance coming to home 03/29/21.  I told her I would call back later this year to get in appt in office   Last AWV 04/17/20  please schedule at anytime with health coach  This should be a 45 minute visit.

## 2021-04-01 ENCOUNTER — Other Ambulatory Visit: Payer: Self-pay | Admitting: Family Medicine

## 2021-04-01 DIAGNOSIS — E785 Hyperlipidemia, unspecified: Secondary | ICD-10-CM

## 2021-04-01 DIAGNOSIS — E1169 Type 2 diabetes mellitus with other specified complication: Secondary | ICD-10-CM

## 2021-07-05 ENCOUNTER — Other Ambulatory Visit: Payer: Self-pay | Admitting: Family Medicine

## 2021-07-05 DIAGNOSIS — E1169 Type 2 diabetes mellitus with other specified complication: Secondary | ICD-10-CM

## 2021-08-21 ENCOUNTER — Encounter: Payer: Self-pay | Admitting: Family Medicine

## 2021-08-21 ENCOUNTER — Ambulatory Visit (INDEPENDENT_AMBULATORY_CARE_PROVIDER_SITE_OTHER): Payer: Medicare Other | Admitting: Family Medicine

## 2021-08-21 VITALS — BP 132/72 | HR 61 | Temp 97.0°F | Wt 140.4 lb

## 2021-08-21 DIAGNOSIS — E1169 Type 2 diabetes mellitus with other specified complication: Secondary | ICD-10-CM

## 2021-08-21 DIAGNOSIS — E1159 Type 2 diabetes mellitus with other circulatory complications: Secondary | ICD-10-CM

## 2021-08-21 DIAGNOSIS — E785 Hyperlipidemia, unspecified: Secondary | ICD-10-CM

## 2021-08-21 DIAGNOSIS — I152 Hypertension secondary to endocrine disorders: Secondary | ICD-10-CM | POA: Diagnosis not present

## 2021-08-21 DIAGNOSIS — D126 Benign neoplasm of colon, unspecified: Secondary | ICD-10-CM | POA: Diagnosis not present

## 2021-08-21 DIAGNOSIS — E1129 Type 2 diabetes mellitus with other diabetic kidney complication: Secondary | ICD-10-CM | POA: Diagnosis not present

## 2021-08-21 DIAGNOSIS — R809 Proteinuria, unspecified: Secondary | ICD-10-CM

## 2021-08-21 DIAGNOSIS — N1831 Chronic kidney disease, stage 3a: Secondary | ICD-10-CM | POA: Diagnosis not present

## 2021-08-21 LAB — POCT GLYCOSYLATED HEMOGLOBIN (HGB A1C): Hemoglobin A1C: 7.1 % — AB (ref 4.0–5.6)

## 2021-08-21 LAB — POCT UA - MICROALBUMIN
Albumin/Creatinine Ratio, Urine, POC: <5.3
Creatinine, POC: 93.5 mg/dL
Microalbumin Ur, POC: <5 mg/L

## 2021-08-21 NOTE — Progress Notes (Signed)
Subjective:    Patient ID: [name], male    DOB: 02/15/1951, 71 y.o.   MRN: [id number]  JEFFORY SNELGROVE is a 71 y.o. male who presents for follow-up of Type 2 diabetes mellitus.  Home blood sugar records:  fasting  between 118 -180 Current symptoms/problems include none and have been stable. Daily foot checks: yes  Any foot concerns: left foot little toe area Exercise:  walking an yard work Diet:good he has been making some dietary changes and has noted weight loss.  He does have a history of colonic polyps and is scheduled for routine follow-up colonoscopy.  He has had no abdominal pain, nausea, vomiting, constipation or diarrhea.  He continues on atorvastatin without difficulty.  Continues on enalapril/HCTZ without difficulty.  He is also taking [place] and Jentadueto without difficulty. The following portions of the patient's history were reviewed and updated as appropriate: allergies, current medications, past medical history, past social history and problem list.  ROS as in subjective above.     Objective:    Physical Exam Alert and in no distress diabetic foot exam is normal.  Hemoglobin A1c is 7.1.  Lab Review    Latest Ref Rng & Units 02/20/2021    4:51 PM 10/15/2020   10:45 AM 04/17/2020    1:49 PM 04/17/2020    1:23 PM 04/17/2020   11:03 AM  Diabetic Labs  HbA1c 4.0 - 5.6 % 8.2  7.5    8.3   Microalbumin mg/L    8.1    Chol 100 - 199 mg/dL   122     HDL >39 mg/dL   40     Calc LDL 0 - 99 mg/dL   64     Triglycerides 0 - 149 mg/dL   95     Creatinine 0.76 - 1.27 mg/dL   1.41         02/20/2021    3:43 PM 10/15/2020   10:19 AM 04/17/2020   11:26 AM 12/14/2019    2:55 PM 08/30/2019    8:55 AM  BP/Weight  Systolic BP 893 810 175 102 585  Diastolic BP 80 74 82 82 76  Wt. (Lbs) 149.4 149.8 157.2 155.9 153.2  BMI 20.84 kg/m2 20.89 kg/m2 21.92 kg/m2 21.74 kg/m2 21.37 kg/m2      Latest Ref Rng & Units 04/17/2020   11:00 AM 08/05/2019   12:00 AM  Foot/eye  exam completion dates  Eye Exam No Retinopathy  No Retinopathy      Foot Form Completion  Done      This result is from an external source.    [place]  reports that he quit smoking about 41 years ago. His smoking use included cigarettes. He has never used smokeless tobacco. He reports current drug use. Drug: Marijuana. He reports that he does not drink alcohol.     Assessment & Plan:    Controlled type 2 diabetes mellitus with microalbuminuria, without long-term current use of insulin ([place]) - Plan: POCT glycosylated hemoglobin (Hb A1C), POCT UA - Microalbumin  Hypertension associated with diabetes ([place])  Hyperlipidemia associated with type 2 diabetes mellitus (HCC)  Stage 3a chronic kidney disease (HCC)  Adenomatous polyp of colon, unspecified part of colon  I think the weight loss although significant is probably due to dietary changes to help get his A1c under better control.  Recommend he continue on present medication regimen and will be set up to come back for routine follow-up in 4 to 6 months.

## 2021-08-22 LAB — COMPREHENSIVE METABOLIC PANEL
ALT: 14 IU/L (ref 0–44)
AST: 16 IU/L (ref 0–40)
Albumin/Globulin Ratio: 1.8 (ref 1.2–2.2)
Albumin: 4.4 g/dL (ref 3.8–4.8)
Alkaline Phosphatase: 68 IU/L (ref 44–121)
BUN/Creatinine Ratio: 27 — ABNORMAL HIGH (ref 10–24)
BUN: 41 mg/dL — ABNORMAL HIGH (ref 8–27)
Bilirubin Total: <0.2 mg/dL (ref 0.0–1.2)
CO2: 22 mmol/L (ref 20–29)
Calcium: 9.6 mg/dL (ref 8.6–10.2)
Chloride: 105 mmol/L (ref 96–106)
Creatinine, Ser: 1.54 mg/dL — ABNORMAL HIGH (ref 0.76–1.27)
Globulin, Total: 2.4 g/dL (ref 1.5–4.5)
Glucose: 140 mg/dL — ABNORMAL HIGH (ref 70–99)
Potassium: 4.5 mmol/L (ref 3.5–5.2)
Sodium: 141 mmol/L (ref 134–144)
Total Protein: 6.8 g/dL (ref 6.0–8.5)
eGFR: 49 mL/min/{1.73_m2} — ABNORMAL LOW (ref >59)

## 2021-08-22 LAB — CBC WITH DIFFERENTIAL/PLATELET
Basophils Absolute: 0 10*3/uL (ref 0.0–0.2)
Basos: 1 %
EOS (ABSOLUTE): 0.6 10*3/uL — ABNORMAL HIGH (ref 0.0–0.4)
Eos: 7 %
Hematocrit: 41.3 % (ref 37.5–51.0)
Hemoglobin: 13.9 g/dL (ref 13.0–17.7)
Immature Grans (Abs): 0 10*3/uL (ref 0.0–0.1)
Immature Granulocytes: 0 %
Lymphocytes Absolute: 3.1 10*3/uL (ref 0.7–3.1)
Lymphs: 39 %
MCH: 29.4 pg (ref 26.6–33.0)
MCHC: 33.7 g/dL (ref 31.5–35.7)
MCV: 88 fL (ref 79–97)
Monocytes Absolute: 0.7 10*3/uL (ref 0.1–0.9)
Monocytes: 9 %
Neutrophils Absolute: 3.5 10*3/uL (ref 1.4–7.0)
Neutrophils: 44 %
Platelets: 234 10*3/uL (ref 150–450)
RBC: 4.72 x10E6/uL (ref 4.14–5.80)
RDW: 13.8 % (ref 11.6–15.4)
WBC: 8 10*3/uL (ref 3.4–10.8)

## 2021-08-22 LAB — LIPID PANEL
Chol/HDL Ratio: 3.4 ratio (ref 0.0–5.0)
Cholesterol, Total: 134 mg/dL (ref 100–199)
HDL: 40 mg/dL (ref >39)
LDL Chol Calc (NIH): 76 mg/dL (ref 0–99)
Triglycerides: 93 mg/dL (ref 0–149)
VLDL Cholesterol Cal: 18 mg/dL (ref 5–40)

## 2021-08-28 ENCOUNTER — Other Ambulatory Visit: Payer: Self-pay | Admitting: Family Medicine

## 2021-08-28 DIAGNOSIS — E1159 Type 2 diabetes mellitus with other circulatory complications: Secondary | ICD-10-CM

## 2021-09-24 ENCOUNTER — Other Ambulatory Visit: Payer: Self-pay | Admitting: Family Medicine

## 2021-09-24 DIAGNOSIS — E1169 Type 2 diabetes mellitus with other specified complication: Secondary | ICD-10-CM

## 2021-10-01 DIAGNOSIS — H40033 Anatomical narrow angle, bilateral: Secondary | ICD-10-CM | POA: Diagnosis not present

## 2021-10-01 DIAGNOSIS — E119 Type 2 diabetes mellitus without complications: Secondary | ICD-10-CM | POA: Diagnosis not present

## 2021-10-01 LAB — HM DIABETES EYE EXAM

## 2021-10-31 ENCOUNTER — Telehealth: Payer: Self-pay | Admitting: Family Medicine

## 2021-10-31 NOTE — Telephone Encounter (Signed)
Left message for patient to call back and schedule Medicare Annual Wellness Visit (AWV) either virtually or in office. I left my number for patient to call [phone].  Last AWV 04/17/20 ; please schedule at anytime with health coach

## 2021-11-19 ENCOUNTER — Ambulatory Visit (INDEPENDENT_AMBULATORY_CARE_PROVIDER_SITE_OTHER): Payer: Medicare Other | Admitting: Family Medicine

## 2021-11-19 ENCOUNTER — Encounter: Payer: Self-pay | Admitting: Family Medicine

## 2021-11-19 VITALS — BP 100/68 | HR 75 | Temp 97.2°F | Ht 71.5 in | Wt 141.8 lb

## 2021-11-19 DIAGNOSIS — E1169 Type 2 diabetes mellitus with other specified complication: Secondary | ICD-10-CM | POA: Diagnosis not present

## 2021-11-19 DIAGNOSIS — Z0000 Encounter for general adult medical examination without abnormal findings: Secondary | ICD-10-CM | POA: Diagnosis not present

## 2021-11-19 DIAGNOSIS — E1159 Type 2 diabetes mellitus with other circulatory complications: Secondary | ICD-10-CM

## 2021-11-19 DIAGNOSIS — Z23 Encounter for immunization: Secondary | ICD-10-CM | POA: Diagnosis not present

## 2021-11-19 DIAGNOSIS — E785 Hyperlipidemia, unspecified: Secondary | ICD-10-CM

## 2021-11-19 DIAGNOSIS — R809 Proteinuria, unspecified: Secondary | ICD-10-CM

## 2021-11-19 DIAGNOSIS — I152 Hypertension secondary to endocrine disorders: Secondary | ICD-10-CM | POA: Diagnosis not present

## 2021-11-19 DIAGNOSIS — E1129 Type 2 diabetes mellitus with other diabetic kidney complication: Secondary | ICD-10-CM

## 2021-11-19 DIAGNOSIS — N1831 Chronic kidney disease, stage 3a: Secondary | ICD-10-CM

## 2021-11-19 LAB — POCT GLYCOSYLATED HEMOGLOBIN (HGB A1C): Hemoglobin A1C: 6.9 % — AB (ref 4.0–5.6)

## 2021-11-19 MED ORDER — ENALAPRIL-HYDROCHLOROTHIAZIDE 5-12.5 MG PO TABS: 1.0000 | ORAL_TABLET | Freq: Every day | ORAL | 1 refills | Status: DC

## 2021-11-19 MED ORDER — JENTADUETO 2.5-1000 MG PO TABS: 2.0000 | ORAL_TABLET | Freq: Every day | ORAL | 1 refills | Status: DC

## 2021-11-19 MED ORDER — ATORVASTATIN CALCIUM 40 MG PO TABS
40.0000 mg | ORAL_TABLET | Freq: Every day | ORAL | 3 refills | Status: DC
Start: 2021-11-19 — End: 2022-10-06

## 2021-11-19 NOTE — Progress Notes (Signed)
[name] is a 71 y.o. male who presents for annual wellness visit,CPE and follow-up on chronic medical conditions.  He has no particular concerns or questions.  He continues on his Lipitor without difficulty.  He is also taking enalapril/HCTZ.  Continues on his diabetes medications and having no particular complaints about them.  Does take a baby aspirin.  Review of the record indicates he does have CKD 3.  He is retired but did keep himself very physically active.  He just turned 38.  Medical record was reviewed.   Immunizations and Health Maintenance Immunization History  Administered Date(s) Administered   Fluad Quad(high Dose 65+) 10/27/2018, 11/28/2020   Influenza Split 02/20/2011, 12/25/2012   Influenza, High Dose Seasonal PF 12/21/2017   Influenza,inj,Quad PF,6+ Mos 11/10/2013, 10/26/2015, 11/06/2016   Influenza-Unspecified 11/24/2019   PFIZER(Purple Top)SARS-COV-2 Vaccination 03/31/2019, 04/21/2019, 12/14/2019   Pfizer Covid-19 Vaccine Bivalent Booster 45yr & up 11/28/2020   Pneumococcal Conjugate-13 11/10/2013   Pneumococcal Polysaccharide-23 10/15/2010   Tdap 02/25/2007, 07/25/2016   Zoster Recombinat (Shingrix) 07/25/2016, 11/06/2016   Zoster, Live 02/09/2012   Health Maintenance Due  Topic Date Due   COVID-19 Vaccine (5 - Pfizer series) 03/31/2021   INFLUENZA VACCINE  09/24/2021    Last colonoscopy: 12/21/2014 Dr. AHavery MorosGI Last PSA: n/a  Dentist:n/a Ophtho: Q year  Exercise: Walking  and yard work  QD  Other doctors caring for patient include: Dr. AHavery MorosGI              Advanced Directives: Does Patient Have a Medical Advance Directive?: No Would patient like information on creating a medical advance directive?: No - Patient declined  Depression screen:  See questionnaire below.        11/19/2021    1:59 PM 08/21/2021    3:02 PM 04/17/2020   11:19 AM 08/30/2019    8:53 AM 04/27/2018    8:40 AM  Depression screen PHQ 2/9  Decreased Interest 0 0  0 0 0  Down, Depressed, Hopeless 0 0 0 0 0  PHQ - 2 Score 0 0 0 0 0    Fall Screen: See Questionaire below.      11/19/2021    2:01 PM 08/21/2021    3:02 PM 04/17/2020   11:18 AM 08/30/2019    8:53 AM 04/27/2018    8:40 AM  Fall Risk   Falls in the past year? 0 0 0 0 0  Number falls in past yr: 0 0 0    Injury with Fall? 0 0 0    Risk for fall due to : No Fall Risks No Fall Risks No Fall Risks    Follow up Falls evaluation completed Falls evaluation completed Falls evaluation completed      ADL screen:  See questionnaire below.  Functional Status Survey: Is the patient deaf or have difficulty hearing?: No Does the patient have difficulty seeing, even when wearing glasses/contacts?: No Does the patient have difficulty concentrating, remembering, or making decisions?: No Does the patient have difficulty walking or climbing stairs?: No Does the patient have difficulty dressing or bathing?: No Does the patient have difficulty doing errands alone such as visiting a doctor's office or shopping?: No   Review of Systems  Constitutional: -, -unexpected weight change, -anorexia, -fatigue Allergy: -sneezing, -itching, -congestion Dermatology: denies changing moles, rash, lumps ENT: -runny nose, -ear pain, -sore throat,  Cardiology:  -chest pain, -palpitations, -orthopnea, Respiratory: -cough, -shortness of breath, -dyspnea on exertion, -wheezing,  Gastroenterology: -abdominal pain, -nausea, -vomiting, -diarrhea, -  constipation, -dysphagia Hematology: -bleeding or bruising problems Musculoskeletal: -arthralgias, -myalgias, -joint swelling, -back pain, - Ophthalmology: -vision changes,  Urology: -dysuria, -difficulty urinating,  -urinary frequency, -urgency, incontinence Neurology: -, -numbness, , -memory loss, -falls, -dizziness    PHYSICAL EXAM:  BP 100/68   Pulse 75   Temp (!) 97.2 F (36.2 C)   Ht 5' 11.5" (1.816 m)   Wt 141 lb 12.8 oz (64.3 kg)   SpO2 99%   BMI 19.50 kg/m    General Appearance: Alert, cooperative, no distress, appears stated age Head: Normocephalic, without obvious abnormality, atraumatic Eyes: PERRL, conjunctiva/corneas clear, EOM's intact,  Ears: Normal TM's and external ear canals Nose: Nares normal, mucosa normal, no drainage or sinus   tenderness Throat: Lips, mucosa, and tongue normal; teeth and gums normal Neck: Supple, no lymphadenopathy, thyroid:no enlargement/tenderness/nodules; no carotid bruit or JVD Lungs: Clear to auscultation bilaterally without wheezes, rales or ronchi; respirations unlabored Heart: Regular rate and rhythm, S1 and S2 normal, no murmur, rub or gallop Abdomen: Soft, non-tender, nondistended, normoactive bowel sounds, no masses, no hepatosplenomegaly Extremities: No clubbing, cyanosis or edema Pulses: 2+ and symmetric all extremities Skin: Skin color, texture, turgor normal, no rashes or lesions Lymph nodes: Cervical, supraclavicular, and axillary nodes normal Neurologic: CNII-XII intact, normal strength, sensation and gait; reflexes 2+ and symmetric throughout   Psych: Normal mood, affect, hygiene and grooming Recent blood work was reviewed.  Hemoglobin A1c is 6.9 ASSESSMENT/PLAN: Routine general medical examination at a health care facility  Need for influenza vaccination - Plan: Flu Vaccine QUAD High Dose(Fluad)  Controlled type 2 diabetes mellitus with microalbuminuria, without long-term current use of insulin ([place]) - Plan: POCT glycosylated hemoglobin (Hb A1C), linaGLIPtin-metFORMIN HCl (JENTADUETO) 2.06-998 MG TABS  Hypertension associated with diabetes ([place]) - Plan: Enalapril-hydroCHLOROthiazide 5-12.5 MG tablet  Hyperlipidemia associated with type 2 diabetes mellitus ([place]) - Plan: atorvastatin (LIPITOR) 40 MG tablet  Stage 3a chronic kidney disease ([place])  Continue on present medications. Discussed  at least 30 minutes of aerobic activity at least 5 days/week; proper sunscreen use reviewed; listed  on him.  Immunization recommendations discussed.  Colonoscopy recommendations reviewed.   Medicare Attestation I have personally reviewed: The patient's medical and social history Their use of alcohol, tobacco or illicit drugs Their current medications and supplements The patient's functional ability including ADLs,fall risks, home safety risks, cognitive, and hearing and visual impairment Diet and physical activities Evidence for depression or mood disorders  The patient's weight, height, and BMI have been recorded in the chart.  I have made referrals, counseling, and provided education to the patient based on review of the above and I have provided the patient with a written personalized care plan for preventive services.     James Alexanders, MD   11/19/2021

## 2021-11-19 NOTE — Patient Instructions (Signed)
Health Maintenance, Male Adopting a healthy lifestyle and getting preventive care are important in promoting health and wellness. Ask your health care provider about: The right schedule for you to have regular tests and exams. Things you can do on your own to prevent diseases and keep yourself healthy. What should I know about diet, weight, and exercise? Eat a healthy diet  Eat a diet that includes plenty of vegetables, fruits, low-fat dairy products, and lean protein. Do not eat a lot of foods that are high in solid fats, added sugars, or sodium. Maintain a healthy weight Body mass index (BMI) is a measurement that can be used to identify possible weight problems. It estimates body fat based on height and weight. Your health care provider can help determine your BMI and help you achieve or maintain a healthy weight. Get regular exercise Get regular exercise. This is one of the most important things you can do for your health. Most adults should: Exercise for at least 150 minutes each week. The exercise should increase your heart rate and make you sweat (moderate-intensity exercise). Do strengthening exercises at least twice a week. This is in addition to the moderate-intensity exercise. Spend less time sitting. Even light physical activity can be beneficial. Watch cholesterol and blood lipids Have your blood tested for lipids and cholesterol at 70 years of age, then have this test every 5 years. You may need to have your cholesterol levels checked more often if: Your lipid or cholesterol levels are high. You are older than 70 years of age. You are at high risk for heart disease. What should I know about cancer screening? Many types of cancers can be detected early and may often be prevented. Depending on your health history and family history, you may need to have cancer screening at various ages. This may include screening for: Colorectal cancer. Prostate cancer. Skin cancer. Lung  cancer. What should I know about heart disease, diabetes, and high blood pressure? Blood pressure and heart disease High blood pressure causes heart disease and increases the risk of stroke. This is more likely to develop in people who have high blood pressure readings or are overweight. Talk with your health care provider about your target blood pressure readings. Have your blood pressure checked: Every 3-5 years if you are 18-39 years of age. Every year if you are 40 years old or older. If you are between the ages of 65 and 75 and are a current or former smoker, ask your health care provider if you should have a one-time screening for abdominal aortic aneurysm (AAA). Diabetes Have regular diabetes screenings. This checks your fasting blood sugar level. Have the screening done: Once every three years after age 45 if you are at a normal weight and have a low risk for diabetes. More often and at a younger age if you are overweight or have a high risk for diabetes. What should I know about preventing infection? Hepatitis B If you have a higher risk for hepatitis B, you should be screened for this virus. Talk with your health care provider to find out if you are at risk for hepatitis B infection. Hepatitis C Blood testing is recommended for: Everyone born from 1945 through 1965. Anyone with known risk factors for hepatitis C. Sexually transmitted infections (STIs) You should be screened each year for STIs, including gonorrhea and chlamydia, if: You are sexually active and are younger than 70 years of age. You are older than 70 years of age and your   health care provider tells you that you are at risk for this type of infection. Your sexual activity has changed since you were last screened, and you are at increased risk for chlamydia or gonorrhea. Ask your health care provider if you are at risk. Ask your health care provider about whether you are at high risk for HIV. Your health care provider  may recommend a prescription medicine to help prevent HIV infection. If you choose to take medicine to prevent HIV, you should first get tested for HIV. You should then be tested every 3 months for as long as you are taking the medicine. Follow these instructions at home: Alcohol use Do not drink alcohol if your health care provider tells you not to drink. If you drink alcohol: Limit how much you have to 0-2 drinks a day. Know how much alcohol is in your drink. In the [place] one drink equals one 12 oz bottle of beer (355 mL), one 5 oz glass of wine (148 mL), or one 1 oz glass of hard liquor (44 mL). Lifestyle Do not use any products that contain nicotine or tobacco. These products include cigarettes, chewing tobacco, and vaping devices, such as e-cigarettes. If you need help quitting, ask your health care provider. Do not use street drugs. Do not share needles. Ask your health care provider for help if you need support or information about quitting drugs. General instructions Schedule regular health, dental, and eye exams. Stay current with your vaccines. Tell your health care provider if: You often feel depressed. You have ever been abused or do not feel safe at home. Summary Adopting a healthy lifestyle and getting preventive care are important in promoting health and wellness. Follow your health care provider's instructions about healthy diet, exercising, and getting tested or screened for diseases. Follow your health care provider's instructions on monitoring your cholesterol and blood pressure. This information is not intended to replace advice given to you by your health care provider. Make sure you discuss any questions you have with your health care provider. Document Revised: 07/02/2020 Document Reviewed: 07/02/2020 Elsevier Patient Education  2023 [employer]  

## 2021-12-03 ENCOUNTER — Encounter: Payer: Self-pay | Admitting: Family Medicine

## 2022-02-24 ENCOUNTER — Telehealth: Payer: Self-pay | Admitting: Family Medicine

## 2022-02-24 DIAGNOSIS — E1129 Type 2 diabetes mellitus with other diabetic kidney complication: Secondary | ICD-10-CM

## 2022-02-24 NOTE — Telephone Encounter (Signed)
PT ASSISTANCE JENTADUETO RENEWAL 2024

## 2022-02-27 ENCOUNTER — Other Ambulatory Visit: Payer: Self-pay | Admitting: Family Medicine

## 2022-02-27 DIAGNOSIS — E1159 Type 2 diabetes mellitus with other circulatory complications: Secondary | ICD-10-CM

## 2022-03-06 NOTE — Telephone Encounter (Signed)
Pt Assistance approved for 2024

## 2022-03-13 ENCOUNTER — Telehealth: Payer: Self-pay | Admitting: Family Medicine

## 2022-03-13 NOTE — Telephone Encounter (Signed)
Wife informed

## 2022-03-13 NOTE — Telephone Encounter (Signed)
PT ASSISTANCE RENEWAL FARXIGA [id number] application printed & given to wife

## 2022-03-20 ENCOUNTER — Ambulatory Visit (INDEPENDENT_AMBULATORY_CARE_PROVIDER_SITE_OTHER): Payer: Medicare Other | Admitting: Family Medicine

## 2022-03-20 ENCOUNTER — Encounter: Payer: Self-pay | Admitting: Family Medicine

## 2022-03-20 VITALS — BP 110/62 | HR 85 | Temp 98.3°F | Resp 14 | Wt 145.8 lb

## 2022-03-20 DIAGNOSIS — E1129 Type 2 diabetes mellitus with other diabetic kidney complication: Secondary | ICD-10-CM

## 2022-03-20 DIAGNOSIS — R809 Proteinuria, unspecified: Secondary | ICD-10-CM

## 2022-03-20 DIAGNOSIS — I152 Hypertension secondary to endocrine disorders: Secondary | ICD-10-CM

## 2022-03-20 DIAGNOSIS — D126 Benign neoplasm of colon, unspecified: Secondary | ICD-10-CM | POA: Diagnosis not present

## 2022-03-20 DIAGNOSIS — E1169 Type 2 diabetes mellitus with other specified complication: Secondary | ICD-10-CM | POA: Diagnosis not present

## 2022-03-20 DIAGNOSIS — E785 Hyperlipidemia, unspecified: Secondary | ICD-10-CM

## 2022-03-20 DIAGNOSIS — E1159 Type 2 diabetes mellitus with other circulatory complications: Secondary | ICD-10-CM

## 2022-03-20 DIAGNOSIS — Z23 Encounter for immunization: Secondary | ICD-10-CM | POA: Diagnosis not present

## 2022-03-20 LAB — POCT GLYCOSYLATED HEMOGLOBIN (HGB A1C): Hemoglobin A1C: 8.4 % — AB (ref 4.0–5.6)

## 2022-03-20 NOTE — Progress Notes (Signed)
Subjective:    Patient ID: [name], male    DOB: 1950/07/19, 72 y.o.   MRN: [id number]  [name] is a 72 y.o. male who presents for follow-up of Type 2 diabetes mellitus.  Home blood sugar records: fasting range: 170's Current symptoms/problems include polyuria and have been stable. Daily foot checks: yes   Any foot concerns: no How often blood sugars checked: one a week Exercise: Home exercise routine includes walking and stationary bike. Diet: regular diet.  He does however indicate that he has had some dietary indiscretion. He does not check his blood sugars regularly.  He continues on [place] and is having no difficulty with that.  Continues on atorvastatin without difficulty.  He does have a history of colonic polyps and is scheduled for routine follow-up on that.  He does admit to nocturia but also drinking fluids at night. The following portions of the patient's history were reviewed and updated as appropriate: allergies, current medications, past medical history, past social history and problem list.  ROS as in subjective above.     Objective:    Physical Exam Alert and in no distress otherwise not examined. Hemoglobin A1c is 8.4 Blood pressure 110/62, pulse 85, temperature 98.3 F (36.8 C), temperature source Oral, resp. rate 14, weight 145 lb 12.8 oz (66.1 kg), SpO2 97 %.  Lab Review    Latest Ref Rng & Units 11/19/2021    3:51 PM 08/21/2021    4:28 PM 08/21/2021    4:27 PM 08/21/2021    3:33 PM 02/20/2021    4:51 PM  Diabetic Labs  HbA1c 4.0 - 5.6 % 6.9   7.1   8.2   Microalbumin mg/L  <5.0      Micro/Creat Ratio   <5.3      Chol 100 - 199 mg/dL    134    HDL >39 mg/dL    40    Calc LDL 0 - 99 mg/dL    76    Triglycerides 0 - 149 mg/dL    93    Creatinine 0.76 - 1.27 mg/dL    1.54        03/20/2022    3:53 PM 11/19/2021    2:09 PM 08/21/2021    3:07 PM 02/20/2021    3:43 PM 10/15/2020   10:19 AM  BP/Weight  Systolic BP 735 329  924 268 341  Diastolic BP 62 68 72 80 74  Wt. (Lbs) 145.8 141.8 140.4 149.4 149.8  BMI 20.05 kg/m2 19.5 kg/m2 19.58 kg/m2 20.84 kg/m2 20.89 kg/m2      Latest Ref Rng & Units 10/01/2021   12:00 AM 08/21/2021    3:00 PM  Foot/eye exam completion dates  Eye Exam No Retinopathy No Retinopathy       Foot Form Completion   Done     This result is from an external source.    [name]  reports that he quit smoking about 41 years ago. His smoking use included cigarettes. He has never used smokeless tobacco. He reports current drug use. Drug: Marijuana. He reports that he does not drink alcohol.     Assessment & Plan:    Controlled type 2 diabetes mellitus with microalbuminuria, without long-term current use of insulin ([place]) - Plan: POCT glycosylated hemoglobin (Hb A1C)  Need for COVID-19 vaccine - Plan: Pfizer Fall 2023 Covid-19 Vaccine 73yr and older, [place] 2023 Covid-19 Vaccine 168yrand older  Hypertension associated with diabetes ([place] Hyperlipidemia  associated with type 2 diabetes mellitus ([place])  Adenomatous polyp of colon, unspecified part of colon  Explained that he needs to start checking his blood sugars more often, either before a meal or preferably 2 hours after meal and then adjust his eating habits based on that.  He expressed understanding of this and plans to do it.  Discussed the possibility of increasing his medications but he would rather work on dietary modification.  He also indicated nocturia but admits that he has been doing a lot of drinking of fluids at night.  Strongly encouraged him to cut back on fluids at night and empty his bladder before he goes to bed. Recheck in 4 months.

## 2022-04-29 MED ORDER — JENTADUETO XR 5-1000 MG PO TB24: 1.0000 | ORAL_TABLET | Freq: Every day | ORAL | 3 refills | Status: DC

## 2022-04-29 NOTE — Telephone Encounter (Unsigned)
Wife [employer] called & [facility] has issue with RX for Jentadueto, pt was last on 06/998 & new Rx was for 2.5 BID. Wife states that is what pt has been on and that hasn't changed and wants to stay on '5mg'$  once day not 2.5 BID.  So gave verbal authorization for the '5mg'$ , they will ship within 7 days,  I asked for expedited shipping.

## 2022-05-01 ENCOUNTER — Telehealth: Payer: Self-pay | Admitting: Family Medicine

## 2022-05-01 MED ORDER — METFORMIN HCL 1000 MG PO TABS: 1000.0000 mg | ORAL_TABLET | Freq: Two times a day (BID) | ORAL | 3 refills | Status: DC

## 2022-05-01 NOTE — Telephone Encounter (Signed)
Pt is out of his Jendadueto & waiting on Pt Assistance, we only have samples of Tradjenta 5 mg, can you send in Metformin '1000mg'$  [facility] until he gets the patient assistance.

## 2022-05-02 NOTE — Telephone Encounter (Signed)
PAP renewal faxed

## 2022-05-30 ENCOUNTER — Other Ambulatory Visit: Payer: Self-pay | Admitting: Family Medicine

## 2022-05-30 DIAGNOSIS — I152 Hypertension secondary to endocrine disorders: Secondary | ICD-10-CM

## 2022-06-02 ENCOUNTER — Telehealth: Payer: Self-pay

## 2022-06-02 NOTE — Telephone Encounter (Signed)
Pt's wife called and stated the pharmacy told her enalapril is no longer approved. I advised her that if the pharmacy told her this then it is likely that he may need a PA. She advised she would reach out to the pharmacy to have them fax the form over to [place]. I provided her our fax number.

## 2022-06-02 NOTE — Telephone Encounter (Signed)
Received refill request via fax from pharmacy for enalapril/hctz tabs. Last filled on 02/28/22, 90 tabs with 1 refill. Pt has appt scheduled for 08/06/22.

## 2022-06-24 ENCOUNTER — Telehealth: Payer: Self-pay | Admitting: Family Medicine

## 2022-06-24 DIAGNOSIS — I152 Hypertension secondary to endocrine disorders: Secondary | ICD-10-CM

## 2022-06-24 MED ORDER — ENALAPRIL-HYDROCHLOROTHIAZIDE 5-12.5 MG PO TABS: 1.0000 | ORAL_TABLET | Freq: Every day | ORAL | 0 refills | Status: DC

## 2022-06-24 NOTE — Telephone Encounter (Signed)
Pt needs a refill for Enalapril-hydroCHLOROthiazide 5-12.5 MG tablet to [facility], [place] - [address]

## 2022-07-04 NOTE — Telephone Encounter (Signed)
Received fax from [facility] shipping medication to patient

## 2022-07-27 ENCOUNTER — Other Ambulatory Visit: Payer: Self-pay | Admitting: Family Medicine

## 2022-08-06 ENCOUNTER — Encounter: Payer: Self-pay | Admitting: Family Medicine

## 2022-08-06 ENCOUNTER — Ambulatory Visit (INDEPENDENT_AMBULATORY_CARE_PROVIDER_SITE_OTHER): Payer: Medicare Other | Admitting: Family Medicine

## 2022-08-06 VITALS — BP 130/72 | HR 69 | Temp 97.9°F | Resp 14 | Wt 152.0 lb

## 2022-08-06 DIAGNOSIS — R809 Proteinuria, unspecified: Secondary | ICD-10-CM | POA: Diagnosis not present

## 2022-08-06 DIAGNOSIS — E1159 Type 2 diabetes mellitus with other circulatory complications: Secondary | ICD-10-CM

## 2022-08-06 DIAGNOSIS — D126 Benign neoplasm of colon, unspecified: Secondary | ICD-10-CM | POA: Diagnosis not present

## 2022-08-06 DIAGNOSIS — I152 Hypertension secondary to endocrine disorders: Secondary | ICD-10-CM

## 2022-08-06 DIAGNOSIS — Z800 Family history of malignant neoplasm of digestive organs: Secondary | ICD-10-CM | POA: Diagnosis not present

## 2022-08-06 DIAGNOSIS — E1169 Type 2 diabetes mellitus with other specified complication: Secondary | ICD-10-CM

## 2022-08-06 DIAGNOSIS — N529 Male erectile dysfunction, unspecified: Secondary | ICD-10-CM

## 2022-08-06 DIAGNOSIS — E1129 Type 2 diabetes mellitus with other diabetic kidney complication: Secondary | ICD-10-CM | POA: Diagnosis not present

## 2022-08-06 DIAGNOSIS — E785 Hyperlipidemia, unspecified: Secondary | ICD-10-CM

## 2022-08-06 DIAGNOSIS — N1831 Chronic kidney disease, stage 3a: Secondary | ICD-10-CM

## 2022-08-06 LAB — POCT GLYCOSYLATED HEMOGLOBIN (HGB A1C): Hemoglobin A1C: 7.4 % — AB (ref 4.0–5.6)

## 2022-08-06 NOTE — Progress Notes (Signed)
Subjective:    Patient ID: [name], male    DOB: 11-12-1950, 72 y.o.   MRN: [id number]  [name] is a 72 y.o. male who presents for follow-up of Type 2 diabetes mellitus.  He has stopped smoking marijuana.  He does complain of some nocturia but has no difficulty during the day with that.  He continues on metformin, Jentadueto and [place] and is having no difficulty with them.  He does take an Lidopril/HCTZ and continues on Lipitor.  Home blood sugar records:  random 125-150 Current symptoms/problems include polydipsia and polyuria and have been stable. Daily foot checks: yes  Any foot concerns: no How often blood sugars checked:  once a week Exercise: Home exercise routine includes walking and bike riding. Diet: regular The following portions of the patient's history were reviewed and updated as appropriate: allergies, current medications, past medical history, past social history and problem list.  ROS as in subjective above.     Objective:    Physical Exam Alert and in no distress otherwise not examined. A1C7.4  Blood pressure 130/72, pulse 69, temperature 97.9 F (36.6 C), temperature source Oral, resp. rate 14, weight 152 lb (68.9 kg), SpO2 95 %.  Lab Review    Latest Ref Rng & Units 08/06/2022   11:39 AM 03/20/2022    4:07 PM 11/19/2021    3:51 PM 08/21/2021    4:28 PM 08/21/2021    4:27 PM  Diabetic Labs  HbA1c 4.0 - 5.6 % 7.4  8.4  6.9   7.1   Microalbumin mg/L    <5.0    Micro/Creat Ratio     <5.3        08/06/2022   11:18 AM 03/20/2022    3:53 PM 11/19/2021    2:09 PM 08/21/2021    3:07 PM 02/20/2021    3:43 PM  BP/Weight  Systolic BP 130 110 100 132 122  Diastolic BP 72 62 68 72 80  Wt. (Lbs) 152 145.8 141.8 140.4 149.4  BMI 20.9 kg/m2 20.05 kg/m2 19.5 kg/m2 19.58 kg/m2 20.84 kg/m2      Latest Ref Rng & Units 10/01/2021   12:00 AM 08/21/2021    3:00 PM  Foot/eye exam completion dates  Eye Exam No Retinopathy No Retinopathy       Foot Form  Completion   Done     This result is from an external source.    [name]  reports that he quit smoking about 42 years ago. His smoking use included cigarettes. He has never used smokeless tobacco. He reports current drug use. Drug: Marijuana. He reports that he does not drink alcohol.     Assessment & Plan:    Hypertension associated with diabetes (HCC)  Adenomatous polyp of colon, unspecified part of colon  Controlled type 2 diabetes mellitus with microalbuminuria, without long-term current use of insulin (HCC) - Plan: POCT glycosylated hemoglobin (Hb A1C)  Hyperlipidemia associated with type 2 diabetes mellitus (HCC)  Stage 3a chronic kidney disease (HCC)  ED (erectile dysfunction) of organic origin  Family history of colon cancer  I congratulated him on the work that he is doing to eat better and his avoidance of marijuana.  Discussed the nocturia with him and recommend he cut down on his fluids later in the afternoon and evening since the urination is only at night.  Hopefully this will help take care of it did discuss the possibility of using medications as well.  Recheck here in roughly 6 months.

## 2022-08-13 DIAGNOSIS — H40033 Anatomical narrow angle, bilateral: Secondary | ICD-10-CM | POA: Diagnosis not present

## 2022-08-13 DIAGNOSIS — E119 Type 2 diabetes mellitus without complications: Secondary | ICD-10-CM | POA: Diagnosis not present

## 2022-08-13 LAB — HM DIABETES EYE EXAM

## 2022-10-03 ENCOUNTER — Other Ambulatory Visit: Payer: Self-pay | Admitting: Family Medicine

## 2022-10-03 DIAGNOSIS — E1169 Type 2 diabetes mellitus with other specified complication: Secondary | ICD-10-CM

## 2022-10-20 ENCOUNTER — Other Ambulatory Visit: Payer: Self-pay | Admitting: Family Medicine

## 2022-10-20 DIAGNOSIS — E1159 Type 2 diabetes mellitus with other circulatory complications: Secondary | ICD-10-CM

## 2022-11-03 ENCOUNTER — Telehealth: Payer: Self-pay | Admitting: Family Medicine

## 2022-11-03 MED ORDER — DAPAGLIFLOZIN PROPANEDIOL 10 MG PO TABS: 10.0000 mg | ORAL_TABLET | Freq: Every day | ORAL | 3 refills | Status: DC

## 2022-11-03 NOTE — Telephone Encounter (Signed)
Received fax from [place] [place] Patient assistance,  need refill Farxiga #[id number] escribed to [facility]

## 2022-11-11 ENCOUNTER — Telehealth: Payer: Self-pay | Admitting: Family Medicine

## 2022-11-11 NOTE — Telephone Encounter (Signed)
Called patient to let her know we did not have any samples, offered to call in to local pharmacy. She declined offer-said order should be here soon.

## 2022-11-11 NOTE — Telephone Encounter (Signed)
Pt wife called requesting samples of Jentadueto XR 06-998 [place] for mail order to come in

## 2022-11-25 NOTE — Telephone Encounter (Signed)
I failed to note this in chart last wife that Wife called about delay in shipment I called [facility] & they stated there was a switch in pharmacies & pt should get shipped this week. Pt was taking Metformin while he is out of the [place]  I called wife today to see if pt got medication.  Left message

## 2022-12-25 ENCOUNTER — Other Ambulatory Visit: Payer: Self-pay | Admitting: Family Medicine

## 2022-12-25 DIAGNOSIS — E1159 Type 2 diabetes mellitus with other circulatory complications: Secondary | ICD-10-CM

## 2023-01-29 ENCOUNTER — Telehealth: Payer: Self-pay

## 2023-01-29 NOTE — Progress Notes (Addendum)
   01/29/2023  Patient ID: [name], male   DOB: [birth year], 72 y.o.   MRN: [id number]  Confirmed patient has received renewal paperwork for PAP 2025 renewal for Farxiga/Jentadueto. Instructed to call us if any questions or need assistance with filling out.  Sherrill Raring, PharmD Clinical Pharmacist [phone]

## 2023-02-05 ENCOUNTER — Encounter: Payer: Self-pay | Admitting: Family Medicine

## 2023-02-05 ENCOUNTER — Ambulatory Visit (INDEPENDENT_AMBULATORY_CARE_PROVIDER_SITE_OTHER): Payer: Medicare Other | Admitting: Family Medicine

## 2023-02-05 VITALS — BP 126/72 | HR 90 | Resp 14 | Ht 71.0 in | Wt 153.4 lb

## 2023-02-05 DIAGNOSIS — N5201 Erectile dysfunction due to arterial insufficiency: Secondary | ICD-10-CM

## 2023-02-05 DIAGNOSIS — I152 Hypertension secondary to endocrine disorders: Secondary | ICD-10-CM

## 2023-02-05 DIAGNOSIS — E1159 Type 2 diabetes mellitus with other circulatory complications: Secondary | ICD-10-CM | POA: Diagnosis not present

## 2023-02-05 DIAGNOSIS — E1165 Type 2 diabetes mellitus with hyperglycemia: Secondary | ICD-10-CM | POA: Diagnosis not present

## 2023-02-05 DIAGNOSIS — E785 Hyperlipidemia, unspecified: Secondary | ICD-10-CM

## 2023-02-05 DIAGNOSIS — E1169 Type 2 diabetes mellitus with other specified complication: Secondary | ICD-10-CM | POA: Diagnosis not present

## 2023-02-05 DIAGNOSIS — N1831 Chronic kidney disease, stage 3a: Secondary | ICD-10-CM | POA: Diagnosis not present

## 2023-02-05 DIAGNOSIS — E1122 Type 2 diabetes mellitus with diabetic chronic kidney disease: Secondary | ICD-10-CM

## 2023-02-05 LAB — POCT GLYCOSYLATED HEMOGLOBIN (HGB A1C): HbA1c POC (<> result, manual entry): >14 % (ref 4.0–5.6)

## 2023-02-05 MED ORDER — ENALAPRIL-HYDROCHLOROTHIAZIDE 5-12.5 MG PO TABS
1.0000 | ORAL_TABLET | Freq: Every day | ORAL | 2 refills | Status: DC
Start: 2023-02-05 — End: 2023-07-30

## 2023-02-05 MED ORDER — TADALAFIL 20 MG PO TABS
20.0000 mg | ORAL_TABLET | Freq: Every day | ORAL | 0 refills | Status: DC | PRN
Start: 2023-02-05 — End: 2023-12-01

## 2023-02-05 MED ORDER — ATORVASTATIN CALCIUM 40 MG PO TABS
40.0000 mg | ORAL_TABLET | Freq: Every day | ORAL | 1 refills | Status: DC
Start: 2023-02-05 — End: 2023-07-17

## 2023-02-05 MED ORDER — DAPAGLIFLOZIN PROPANEDIOL 10 MG PO TABS
10.0000 mg | ORAL_TABLET | Freq: Every day | ORAL | 3 refills | Status: DC
Start: 2023-02-05 — End: 2023-02-08

## 2023-02-05 NOTE — Progress Notes (Addendum)
Subjective:    Patient ID: [name], male    DOB: March 12, 1950, 72 y.o.   MRN: [id number]  [name] is a 72 y.o. male who presents for follow-up of Type 2 diabetes mellitus.  Patient is checking home blood sugars.   Home blood sugar records: BGs range between 170 and high How often is blood sugars being checked: every other day Current symptoms/problems include hyperglycemia, polydipsia, polyuria, and insomnia  and have been worsening. Daily foot checks: yes   Any foot concerns: none  Last eye exam: Summer 2024 Exercise: Home exercise routine includes walking 1 hrs per week. He is here for a recheck.  He continues on [place] as well as atorvastatin and he enalapril/HCTZ.  He does state that over the last month roughly he has noted polyuria as well as polydipsia and elevated blood sugars to the point where his machine says high.  He has been taking medications as prescribed.  He is also noted difficulty with erectile dysfunction during the same timeframe. The following portions of the patient's history were reviewed and updated as appropriate: allergies, current medications, past medical history, past social history and problem list.  ROS as in subjective above.     Objective:    Physical Exam Alert and in no distress otherwise not examined. Hemoglobin A1c is greater than 14.  Lab Review    Latest Ref Rng & Units 02/05/2023   11:55 AM 08/06/2022   11:39 AM 03/20/2022    4:07 PM 11/19/2021    3:51 PM 08/21/2021    4:28 PM  Diabetic Labs  HbA1c 4.0 - 5.6 % >14  7.4  8.4  6.9    Microalbumin mg/L     <5.0   Micro/Creat Ratio      <5.3       02/05/2023   10:57 AM 08/06/2022   11:18 AM 03/20/2022    3:53 PM 11/19/2021    2:09 PM 08/21/2021    3:07 PM  BP/Weight  Systolic BP 126 130 110 100 132  Diastolic BP 72 72 62 68 72  Wt. (Lbs) 153.4 152 145.8 141.8 140.4  BMI 21.39 kg/m2 20.9 kg/m2 20.05 kg/m2 19.5 kg/m2 19.58 kg/m2      Latest Ref Rng & Units  08/13/2022   12:00 AM 10/01/2021   12:00 AM  Foot/eye exam completion dates  Eye Exam No Retinopathy No Retinopathy     No Retinopathy         This result is from an external source.    [name]  reports that he quit smoking about 42 years ago. His smoking use included cigarettes. He has never used smokeless tobacco. He reports current drug use. Drug: Marijuana. He reports that he does not drink alcohol.     Assessment & Plan:    Uncontrolled type 2 diabetes mellitus with hyperglycemia (HCC) - Plan: POCT glycosylated hemoglobin (Hb A1C), dapagliflozin propanediol (FARXIGA) 10 MG TABS tablet, Comprehensive metabolic panel, CBC with Differential/Platelet, Lipid panel, CANCELED: POCT UA - Microalbumin  Stage 3a chronic kidney disease (HCC)  Hypertension associated with diabetes (HCC) - Plan: Enalapril-hydroCHLOROthiazide 5-12.5 MG tablet, Comprehensive metabolic panel, CBC with Differential/Platelet  Hyperlipidemia associated with type 2 diabetes mellitus (HCC) - Plan: atorvastatin (LIPITOR) 40 MG tablet, Lipid panel  Erectile dysfunction due to arterial insufficiency - Plan: tadalafil (CIALIS) 20 MG tablet  A sample of [place] was given and he is to use 12 units daily.  I instructed him on how to do this.  He is to keep track of his thirst and urination and I will see him back on Tuesday.  I will then readjust his medication and plan to start him on Soliqua, stop the [place], place him on regular metformin.  Not sure what happened   3:20 am I left a message on his cell and home number that his blood sugar was 716 and for him to go to the emergency room

## 2023-02-06 ENCOUNTER — Other Ambulatory Visit: Payer: Self-pay

## 2023-02-06 ENCOUNTER — Telehealth: Payer: Self-pay | Admitting: Family Medicine

## 2023-02-06 ENCOUNTER — Encounter (HOSPITAL_COMMUNITY): Payer: Self-pay

## 2023-02-06 ENCOUNTER — Telehealth (HOSPITAL_COMMUNITY): Payer: Self-pay | Admitting: Pharmacy Technician

## 2023-02-06 ENCOUNTER — Other Ambulatory Visit (HOSPITAL_COMMUNITY): Payer: Self-pay

## 2023-02-06 ENCOUNTER — Inpatient Hospital Stay (HOSPITAL_COMMUNITY)
Admission: EM | Admit: 2023-02-06 | Discharge: 2023-02-08 | DRG: 638 | Disposition: A | Discharge status: Home / Self Care | Payer: Medicare Other | Attending: Internal Medicine | Admitting: Internal Medicine

## 2023-02-06 DIAGNOSIS — E1129 Type 2 diabetes mellitus with other diabetic kidney complication: Principal | ICD-10-CM

## 2023-02-06 DIAGNOSIS — E1159 Type 2 diabetes mellitus with other circulatory complications: Secondary | ICD-10-CM | POA: Diagnosis not present

## 2023-02-06 DIAGNOSIS — I152 Hypertension secondary to endocrine disorders: Secondary | ICD-10-CM | POA: Diagnosis present

## 2023-02-06 DIAGNOSIS — Z79899 Other long term (current) drug therapy: Secondary | ICD-10-CM

## 2023-02-06 DIAGNOSIS — E860 Dehydration: Secondary | ICD-10-CM | POA: Diagnosis present

## 2023-02-06 DIAGNOSIS — E785 Hyperlipidemia, unspecified: Secondary | ICD-10-CM | POA: Diagnosis not present

## 2023-02-06 DIAGNOSIS — E1122 Type 2 diabetes mellitus with diabetic chronic kidney disease: Secondary | ICD-10-CM | POA: Diagnosis present

## 2023-02-06 DIAGNOSIS — R7989 Other specified abnormal findings of blood chemistry: Secondary | ICD-10-CM | POA: Diagnosis not present

## 2023-02-06 DIAGNOSIS — Z7982 Long term (current) use of aspirin: Secondary | ICD-10-CM | POA: Diagnosis not present

## 2023-02-06 DIAGNOSIS — Z7984 Long term (current) use of oral hypoglycemic drugs: Secondary | ICD-10-CM

## 2023-02-06 DIAGNOSIS — N179 Acute kidney failure, unspecified: Secondary | ICD-10-CM | POA: Diagnosis not present

## 2023-02-06 DIAGNOSIS — E1169 Type 2 diabetes mellitus with other specified complication: Secondary | ICD-10-CM | POA: Diagnosis not present

## 2023-02-06 DIAGNOSIS — K219 Gastro-esophageal reflux disease without esophagitis: Secondary | ICD-10-CM | POA: Diagnosis present

## 2023-02-06 DIAGNOSIS — E1110 Type 2 diabetes mellitus with ketoacidosis without coma: Principal | ICD-10-CM | POA: Diagnosis present

## 2023-02-06 DIAGNOSIS — Z794 Long term (current) use of insulin: Secondary | ICD-10-CM

## 2023-02-06 DIAGNOSIS — N1831 Chronic kidney disease, stage 3a: Secondary | ICD-10-CM | POA: Diagnosis not present

## 2023-02-06 DIAGNOSIS — E1165 Type 2 diabetes mellitus with hyperglycemia: Secondary | ICD-10-CM | POA: Diagnosis not present

## 2023-02-06 DIAGNOSIS — R739 Hyperglycemia, unspecified: Principal | ICD-10-CM

## 2023-02-06 DIAGNOSIS — Z87891 Personal history of nicotine dependence: Secondary | ICD-10-CM

## 2023-02-06 LAB — COMPREHENSIVE METABOLIC PANEL
ALT: 24 [IU]/L (ref 0–44)
ALT: 29 U/L (ref 0–44)
AST: 20 [IU]/L (ref 0–40)
AST: 31 U/L (ref 15–41)
Albumin: 4 g/dL (ref 3.8–4.8)
Albumin: 3.9 g/dL (ref 3.5–5.0)
Alkaline Phosphatase: 129 [IU]/L — ABNORMAL HIGH (ref 44–121)
Alkaline Phosphatase: 102 U/L (ref 38–126)
Anion gap: 16 — ABNORMAL HIGH (ref 5–15)
BUN/Creatinine Ratio: 21 (ref 10–24)
BUN: 44 mg/dL — ABNORMAL HIGH (ref 8–27)
BUN: 45 mg/dL — ABNORMAL HIGH (ref 8–23)
Bilirubin Total: 0.4 mg/dL (ref 0.0–1.2)
CO2: 20 mmol/L (ref 20–29)
CO2: 24 mmol/L (ref 22–32)
Calcium: 10.4 mg/dL — ABNORMAL HIGH (ref 8.6–10.2)
Calcium: 9.8 mg/dL (ref 8.9–10.3)
Chloride: 83 mmol/L — ABNORMAL LOW (ref 96–106)
Chloride: 86 mmol/L — ABNORMAL LOW (ref 98–111)
Creatinine, Ser: 2.05 mg/dL — ABNORMAL HIGH (ref 0.76–1.27)
Creatinine, Ser: 1.84 mg/dL — ABNORMAL HIGH (ref 0.61–1.24)
GFR, Estimated: 39 mL/min — ABNORMAL LOW (ref >60)
Globulin, Total: 3.1 g/dL (ref 1.5–4.5)
Glucose, Bld: 597 mg/dL (ref 70–99)
Glucose: 716 mg/dL (ref 70–99)
Potassium: 4.9 mmol/L (ref 3.5–5.2)
Potassium: 5.1 mmol/L (ref 3.5–5.1)
Sodium: 127 mmol/L — ABNORMAL LOW (ref 134–144)
Sodium: 126 mmol/L — ABNORMAL LOW (ref 135–145)
Total Bilirubin: 1.1 mg/dL (ref <1.2)
Total Protein: 7.1 g/dL (ref 6.0–8.5)
Total Protein: 8 g/dL (ref 6.5–8.1)
eGFR: 34 mL/min/{1.73_m2} — ABNORMAL LOW (ref >59)

## 2023-02-06 LAB — CBC WITH DIFFERENTIAL/PLATELET
Basophils Absolute: 0.1 10*3/uL (ref 0.0–0.2)
Basos: 1 %
EOS (ABSOLUTE): 0.2 10*3/uL (ref 0.0–0.4)
Eos: 2 %
Hematocrit: 48.1 % (ref 37.5–51.0)
Hemoglobin: 14.8 g/dL (ref 13.0–17.7)
Immature Grans (Abs): 0 10*3/uL (ref 0.0–0.1)
Immature Granulocytes: 0 %
Lymphocytes Absolute: 1.4 10*3/uL (ref 0.7–3.1)
Lymphs: 17 %
MCH: 27.2 pg (ref 26.6–33.0)
MCHC: 30.8 g/dL — ABNORMAL LOW (ref 31.5–35.7)
MCV: 88 fL (ref 79–97)
Monocytes Absolute: 0.7 10*3/uL (ref 0.1–0.9)
Monocytes: 8 %
Neutrophils Absolute: 6.2 10*3/uL (ref 1.4–7.0)
Neutrophils: 72 %
Platelets: 294 10*3/uL (ref 150–450)
RBC: 5.44 x10E6/uL (ref 4.14–5.80)
RDW: 12.5 % (ref 11.6–15.4)
WBC: 8.6 10*3/uL (ref 3.4–10.8)

## 2023-02-06 LAB — BLOOD GAS, VENOUS
Acid-Base Excess: 2.4 mmol/L — ABNORMAL HIGH (ref 0.0–2.0)
Bicarbonate: 28.8 mmol/L — ABNORMAL HIGH (ref 20.0–28.0)
O2 Saturation: 82.1 %
Patient temperature: 37
pCO2, Ven: 51 mm[Hg] (ref 44–60)
pH, Ven: 7.36 (ref 7.25–7.43)
pO2, Ven: 53 mm[Hg] — ABNORMAL HIGH (ref 32–45)

## 2023-02-06 LAB — LIPID PANEL
Chol/HDL Ratio: 4.9 {ratio} (ref 0.0–5.0)
Cholesterol, Total: 191 mg/dL (ref 100–199)
HDL: 39 mg/dL — ABNORMAL LOW (ref >39)
LDL Chol Calc (NIH): 95 mg/dL (ref 0–99)
Triglycerides: 339 mg/dL — ABNORMAL HIGH (ref 0–149)
VLDL Cholesterol Cal: 57 mg/dL — ABNORMAL HIGH (ref 5–40)

## 2023-02-06 LAB — URINALYSIS, ROUTINE W REFLEX MICROSCOPIC
Bacteria, UA: NONE SEEN
Bilirubin Urine: NEGATIVE
Glucose, UA: >=500 mg/dL — AB
Ketones, ur: 20 mg/dL — AB
Leukocytes,Ua: NEGATIVE
Nitrite: NEGATIVE
Protein, ur: NEGATIVE mg/dL
Specific Gravity, Urine: 1.024 (ref 1.005–1.030)
pH: 5 (ref 5.0–8.0)

## 2023-02-06 LAB — BASIC METABOLIC PANEL
Anion gap: 9 (ref 5–15)
Anion gap: 10 (ref 5–15)
Anion gap: 8 (ref 5–15)
BUN: 40 mg/dL — ABNORMAL HIGH (ref 8–23)
BUN: 36 mg/dL — ABNORMAL HIGH (ref 8–23)
BUN: 35 mg/dL — ABNORMAL HIGH (ref 8–23)
CO2: 27 mmol/L (ref 22–32)
CO2: 27 mmol/L (ref 22–32)
CO2: 29 mmol/L (ref 22–32)
Calcium: 9.5 mg/dL (ref 8.9–10.3)
Calcium: 9 mg/dL (ref 8.9–10.3)
Calcium: 8.9 mg/dL (ref 8.9–10.3)
Chloride: 93 mmol/L — ABNORMAL LOW (ref 98–111)
Chloride: 90 mmol/L — ABNORMAL LOW (ref 98–111)
Chloride: 91 mmol/L — ABNORMAL LOW (ref 98–111)
Creatinine, Ser: 1.46 mg/dL — ABNORMAL HIGH (ref 0.61–1.24)
Creatinine, Ser: 1.38 mg/dL — ABNORMAL HIGH (ref 0.61–1.24)
Creatinine, Ser: 1.39 mg/dL — ABNORMAL HIGH (ref 0.61–1.24)
GFR, Estimated: 51 mL/min — ABNORMAL LOW (ref >60)
GFR, Estimated: 55 mL/min — ABNORMAL LOW (ref >60)
GFR, Estimated: 54 mL/min — ABNORMAL LOW (ref >60)
Glucose, Bld: 277 mg/dL — ABNORMAL HIGH (ref 70–99)
Glucose, Bld: 204 mg/dL — ABNORMAL HIGH (ref 70–99)
Glucose, Bld: 130 mg/dL — ABNORMAL HIGH (ref 70–99)
Potassium: 3.4 mmol/L — ABNORMAL LOW (ref 3.5–5.1)
Potassium: 3.1 mmol/L — ABNORMAL LOW (ref 3.5–5.1)
Potassium: 4 mmol/L (ref 3.5–5.1)
Sodium: 129 mmol/L — ABNORMAL LOW (ref 135–145)
Sodium: 127 mmol/L — ABNORMAL LOW (ref 135–145)
Sodium: 128 mmol/L — ABNORMAL LOW (ref 135–145)

## 2023-02-06 LAB — GLUCOSE, CAPILLARY
Glucose-Capillary: 112 mg/dL — ABNORMAL HIGH (ref 70–99)
Glucose-Capillary: 124 mg/dL — ABNORMAL HIGH (ref 70–99)
Glucose-Capillary: 128 mg/dL — ABNORMAL HIGH (ref 70–99)
Glucose-Capillary: 139 mg/dL — ABNORMAL HIGH (ref 70–99)
Glucose-Capillary: 167 mg/dL — ABNORMAL HIGH (ref 70–99)
Glucose-Capillary: 186 mg/dL — ABNORMAL HIGH (ref 70–99)
Glucose-Capillary: 199 mg/dL — ABNORMAL HIGH (ref 70–99)
Glucose-Capillary: 207 mg/dL — ABNORMAL HIGH (ref 70–99)
Glucose-Capillary: 237 mg/dL — ABNORMAL HIGH (ref 70–99)

## 2023-02-06 LAB — I-STAT CHEM 8, ED
BUN: 49 mg/dL — ABNORMAL HIGH (ref 8–23)
Calcium, Ion: 1.16 mmol/L (ref 1.15–1.40)
Chloride: 91 mmol/L — ABNORMAL LOW (ref 98–111)
Creatinine, Ser: 1.9 mg/dL — ABNORMAL HIGH (ref 0.61–1.24)
Glucose, Bld: 606 mg/dL (ref 70–99)
HCT: 49 % (ref 39.0–52.0)
Hemoglobin: 16.7 g/dL (ref 13.0–17.0)
Potassium: 4.7 mmol/L (ref 3.5–5.1)
Sodium: 127 mmol/L — ABNORMAL LOW (ref 135–145)
TCO2: 26 mmol/L (ref 22–32)

## 2023-02-06 LAB — CBC
HCT: 47.5 % (ref 39.0–52.0)
Hemoglobin: 15.6 g/dL (ref 13.0–17.0)
MCH: 27.5 pg (ref 26.0–34.0)
MCHC: 32.8 g/dL (ref 30.0–36.0)
MCV: 83.6 fL (ref 80.0–100.0)
Platelets: 287 10*3/uL (ref 150–400)
RBC: 5.68 MIL/uL (ref 4.22–5.81)
RDW: 12.8 % (ref 11.5–15.5)
WBC: 8 10*3/uL (ref 4.0–10.5)
nRBC: 0 % (ref 0.0–0.2)

## 2023-02-06 LAB — BETA-HYDROXYBUTYRIC ACID
Beta-Hydroxybutyric Acid: 3.39 mmol/L — ABNORMAL HIGH (ref 0.05–0.27)
Beta-Hydroxybutyric Acid: 0.31 mmol/L — ABNORMAL HIGH (ref 0.05–0.27)

## 2023-02-06 LAB — CBG MONITORING, ED
Glucose-Capillary: 544 mg/dL (ref 70–99)
Glucose-Capillary: 511 mg/dL (ref 70–99)
Glucose-Capillary: 465 mg/dL — ABNORMAL HIGH (ref 70–99)
Glucose-Capillary: 487 mg/dL — ABNORMAL HIGH (ref 70–99)
Glucose-Capillary: 332 mg/dL — ABNORMAL HIGH (ref 70–99)

## 2023-02-06 LAB — PHOSPHORUS: Phosphorus: 2.5 mg/dL (ref 2.5–4.6)

## 2023-02-06 LAB — MAGNESIUM: Magnesium: 2.2 mg/dL (ref 1.7–2.4)

## 2023-02-06 MED ORDER — ONDANSETRON HCL 4 MG/2ML IJ SOLN: 4.0000 mg | Freq: Four times a day (QID) | INTRAMUSCULAR | Status: DC | PRN

## 2023-02-06 MED ORDER — INSULIN REGULAR(HUMAN) IN NACL 100-0.9 UT/100ML-% IV SOLN
INTRAVENOUS | Status: DC
  Administered 2023-02-06: 10 [IU]/h via INTRAVENOUS
  Filled 2023-02-06: qty 100

## 2023-02-06 MED ORDER — ACETAMINOPHEN 325 MG PO TABS: 650.0000 mg | ORAL_TABLET | Freq: Four times a day (QID) | ORAL | Status: DC | PRN

## 2023-02-06 MED ORDER — ENOXAPARIN SODIUM 40 MG/0.4ML IJ SOSY
40.0000 mg | PREFILLED_SYRINGE | INTRAMUSCULAR | Status: DC
  Administered 2023-02-06 – 2023-02-07 (×2): 40 mg via SUBCUTANEOUS
  Filled 2023-02-06 (×2): qty 0.4

## 2023-02-06 MED ORDER — POTASSIUM CHLORIDE 10 MEQ/100ML IV SOLN
10.0000 meq | INTRAVENOUS | Status: AC
Start: 2023-02-06 — End: 2023-02-06
  Administered 2023-02-06 (×2): 10 meq via INTRAVENOUS
  Filled 2023-02-06 (×2): qty 100

## 2023-02-06 MED ORDER — DEXTROSE IN LACTATED RINGERS 5 % IV SOLN: INTRAVENOUS | Status: DC

## 2023-02-06 MED ORDER — LACTATED RINGERS IV BOLUS
1000.0000 mL | Freq: Once | INTRAVENOUS | Status: AC
  Administered 2023-02-06: 1000 mL via INTRAVENOUS

## 2023-02-06 MED ORDER — ACETAMINOPHEN 650 MG RE SUPP
650.0000 mg | Freq: Four times a day (QID) | RECTAL | Status: DC | PRN
Start: 2023-02-06 — End: 2023-02-08

## 2023-02-06 MED ORDER — ONDANSETRON HCL 4 MG PO TABS: 4.0000 mg | ORAL_TABLET | Freq: Four times a day (QID) | ORAL | Status: DC | PRN

## 2023-02-06 MED ORDER — DEXTROSE 50 % IV SOLN: 0.0000 mL | INTRAVENOUS | Status: DC | PRN

## 2023-02-06 MED ORDER — POTASSIUM CHLORIDE CRYS ER 20 MEQ PO TBCR
40.0000 meq | EXTENDED_RELEASE_TABLET | Freq: Once | ORAL | Status: AC
  Administered 2023-02-06: 40 meq via ORAL
  Filled 2023-02-06: qty 2

## 2023-02-06 MED ORDER — LACTATED RINGERS IV SOLN: INTRAVENOUS | Status: AC

## 2023-02-06 MED ORDER — ATORVASTATIN CALCIUM 40 MG PO TABS
40.0000 mg | ORAL_TABLET | Freq: Every day | ORAL | Status: DC
  Administered 2023-02-06 – 2023-02-08 (×3): 40 mg via ORAL
  Filled 2023-02-06 (×3): qty 1

## 2023-02-06 MED ORDER — ASPIRIN 81 MG PO TBEC
81.0000 mg | DELAYED_RELEASE_TABLET | Freq: Every day | ORAL | Status: DC
  Administered 2023-02-06 – 2023-02-08 (×3): 81 mg via ORAL
  Filled 2023-02-06 (×3): qty 1

## 2023-02-06 NOTE — H&P (Signed)
History and Physical    Patient: [name] DOB: Nov 28, 1950 DOA: 02/06/2023 DOS: the patient was seen and examined on 02/06/2023 PCP: Ronnald Nian, MD  Patient coming from: Home  Chief Complaint:  Chief Complaint  Patient presents with   Hyperglycemia   HPI: [name] is a 72 y.o. male with medical history significant of diverticulosis, hyperlipidemia, GERD, hypertension, heart murmur, tobacco use in remission who presented to the emergency department referred by his PCP due to hyperglycemia.  He stated he has been having polyuria and polydipsia for the last 2 weeks despite use send he is [place] I am Jentadueto XR. He denied fever, chills, rhinorrhea, sore throat, wheezing or hemoptysis.  No chest pain, palpitations, diaphoresis, PND, orthopnea or pitting edema of the lower extremities.  No abdominal pain, nausea, emesis, diarrhea, constipation, melena or hematochezia.  No flank pain, dysuria, frequency or hematuria.  Lab work: CBC was normal.  Urine analysis showed greater than 500 glucose and ketones of 20 mg/dL.  There was small hemoglobin.  Venous blood gas with normal pH, pCO2 and O2 saturation.  pO2 was 53 mmHg, HCO3 28.8 and acid-base deficit 2.4.  CMP showed a glucose of 596, BUN 45 and creatinine 1.84 mg/dL.  The rest of the CMP measurements are normal after sodium correction.   ED course: Initial vital signs were temperature 98.2 F, pulse 92, respiration 18, BP 132/87 mmHg O2 sat 97% on room air.  The patient received IV fluids and was started on an insulin infusion.  Review of Systems: As mentioned in the history of present illness. All other systems reviewed and are negative. Past Medical History:  Diagnosis Date   Diabetes mellitus    Diverticulosis 2016   colonoscopy   Dyslipidemia    GERD (gastroesophageal reflux disease)    Heart murmur    Hyperlipidemia    Hypertension    Past Surgical History:  Procedure Laterality Date    COLONOSCOPY     FINGER AMPUTATION     left   FRACTURE SURGERY     left knee   POLYPECTOMY     Social History:  reports that he quit smoking about 42 years ago. His smoking use included cigarettes. He has never used smokeless tobacco. He reports current drug use. Drug: Marijuana. He reports that he does not drink alcohol.  No Known Allergies  Family History  Problem Relation Age of Onset   Colon cancer Neg Hx    Esophageal cancer Neg Hx    Rectal cancer Neg Hx    Stomach cancer Neg Hx     Prior to Admission medications   Medication Sig Start Date End Date Taking? Authorizing Provider  aspirin 81 MG tablet Take 81 mg by mouth daily.   Yes [provider]  atorvastatin (LIPITOR) 40 MG tablet Take 1 tablet (40 mg total) by mouth daily. 02/05/23  Yes Ronnald Nian, MD  dapagliflozin propanediol (FARXIGA) 10 MG TABS tablet Take 1 tablet (10 mg total) by mouth daily before breakfast. 02/05/23  Yes Ronnald Nian, MD  Enalapril-hydroCHLOROthiazide 5-12.5 MG tablet Take 1 tablet by mouth daily. 02/05/23  Yes Ronnald Nian, MD  JENTADUETO XR 06-998 MG TB24 Take 1 tablet by mouth daily. 04/29/22 04/29/23 Yes Ronnald Nian, MD  Multiple Vitamins-Minerals (MULTIVITAMIN WITH MINERALS) tablet Take 1 tablet by mouth daily.   Yes [provider]  tadalafil (CIALIS) 20 MG tablet Take 1 tablet (20 mg total) by mouth daily as needed  for erectile dysfunction. Patient not taking: Reported on 02/06/2023 02/05/23   Ronnald Nian, MD    Physical Exam: Vitals:   02/06/23 0830 02/06/23 1100  BP: 132/87 132/72  Pulse: 92 68  Resp: 18 18  Temp: 98.2 F (36.8 C)   TempSrc: Oral   SpO2: 97% 98%  Weight: 68 kg   Height: 5\' 11"  (1.803 m)    Physical Exam Constitutional:      General: He is awake. He is not in acute distress.    Appearance: He is ill-appearing.  HENT:     Head: Normocephalic.     Nose: No rhinorrhea.     Mouth/Throat:     Mouth: Mucous membranes are dry.   Eyes:     General: No scleral icterus.    Pupils: Pupils are equal, round, and reactive to light.  Neck:     Vascular: No JVD.  Cardiovascular:     Rate and Rhythm: Normal rate and regular rhythm.     Heart sounds: S1 normal and S2 normal.  Pulmonary:     Effort: Pulmonary effort is normal.     Breath sounds: Normal breath sounds.  Abdominal:     General: Bowel sounds are normal. There is no distension.     Palpations: Abdomen is soft.     Tenderness: There is no abdominal tenderness.  Musculoskeletal:     Cervical back: Neck supple.     Right lower leg: No edema.     Left lower leg: No edema.  Skin:    General: Skin is warm and dry.  Neurological:     General: No focal deficit present.     Mental Status: He is alert and oriented to person, place, and time.  Psychiatric:        Mood and Affect: Mood normal.        Behavior: Behavior normal. Behavior is cooperative.     Data Reviewed:  Results are pending, will review when available.  Assessment and Plan: Principal Problem:   DKA, type 2 (HCC) Observation/stepdown. Keep NPO. Continue IV fluids. Continue insulin infusion. Monitor CBG closely. BMP every 4 hours. BHA every 8 hours. Replace electrolytes as needed. Consult diabetes coordinator. Transition to SQ insulin per Endo tool.  Active Problems:   Pseudohyponatremia Secondary to DKA. Follow sodium level.    Stage 3a chronic kidney disease (HCC) With superimposed   AKI (acute kidney injury) (HCC) Continue IV fluids. Hold ARB/ACE. Hold diuretic. Avoid hypotension. Avoid nephrotoxins. Monitor intake and output. Monitor renal function electrolytes.    Hypertension associated with diabetes (HCC) Holding enalapril and HCTZ in the setting of AKI. As needed antihypertensives in the meantime.    Hyperlipidemia associated with type 2 diabetes mellitus (HCC) Continue atorvastatin 40 mg p.o. daily.      Advance Care Planning:   Code Status: Full Code    Consults:   Family Communication:   Severity of Illness: The appropriate patient status for this patient is INPATIENT. Inpatient status is judged to be reasonable and necessary in order to provide the required intensity of service to ensure the patient's safety. The patient's presenting symptoms, physical exam findings, and initial radiographic and laboratory data in the context of their chronic comorbidities is felt to place them at high risk for further clinical deterioration. Furthermore, it is not anticipated that the patient will be medically stable for discharge from the hospital within 2 midnights of admission.   * I certify that at the point of admission it  is my clinical judgment that the patient will require inpatient hospital care spanning beyond 2 midnights from the point of admission due to high intensity of service, high risk for further deterioration and high frequency of surveillance required.*  Author: Bobette Mo, MD 02/06/2023 12:19 PM  For on call review [email or web address].   This document was prepared using Dragon voice recognition software and may contain some unintended transcription errors.

## 2023-02-06 NOTE — Telephone Encounter (Signed)
Pharmacy Patient Advocate Encounter  Insurance verification completed.    The patient is insured through [facility]. Patient has Medicare and is not eligible for a copay card, but may be able to apply for patient assistance, if available.    Ran test claim for Lantus solostar and the current 30 day co-pay is 35.00. Ran test claim for Tresiba flextouch and the current 30 day co-pay is 35.00. Ran test claim for [employer] and the current 30 day co-pay is 35.00.  This test claim was processed through [facility]- copay amounts may vary at other pharmacies due to pharmacy/plan contracts, or as the patient moves through the different stages of their insurance plan.

## 2023-02-06 NOTE — Inpatient Diabetes Management (Signed)
Inpatient Diabetes Program Recommendations  AACE/ADA: New Consensus Statement on Inpatient Glycemic Control (2015)  Target Ranges:  Prepandial:   less than 140 mg/dL      Peak postprandial:   less than 180 mg/dL (1-2 hours)      Critically ill patients:  140 - 180 mg/dL   Lab Results  Component Value Date   GLUCAP 465 (H) 02/06/2023   HGBA1C >14 02/05/2023    Review of Glycemic Control  Diabetes history: DM 2 Outpatient Diabetes medications: Jentadueto 06-998 1 tablet daily, Farxiga 10 mg Daily, given Tresiba 12 units yesterday by PCP prior to coming into the hospital Current orders for Inpatient glycemic control: IV insulin/Endotool  A1c >14% on 12/12  Spoke with pt and daughter at bedside regarding Glucose levels and newly starting on insulin at home. Pt reports he checks his glucose every other day until his meter read high and then he went to see his PCP. His A1c prior to this admission was around a 7% per pt report. Pt went to his PCP and was started on samples of Tresiba basal insulin 12 units which pt took prior to going to bed. Discussed glucose and A1c goals. Walked pt and daughter through operating the insulin pen as a review. Per pt PCP note he will be started on Soliqua in the future. Pt reports going to see PCP on Tuesday. Reviewed the steps of transitioning off IV insulin to SQ regimen here in the hospital. Will monitor glucose trends.  At time of transition consider:  -   Start Semglee 25 units Q24 hours -   Novolog 0-15 units tid + hs scale  Thanks,  Christena Deem RN, MSN, BC-ADM Inpatient Diabetes Coordinator Team Pager [phone] (8a-5p)

## 2023-02-06 NOTE — Telephone Encounter (Signed)
Wife called that she got a message from Dr. Susann Givens that pt's blood sugar was over 700 & to go to the hospital, I checked with Vincenza Hews & advised pt to go to ER [facility] or [facility].  They will go this morning.

## 2023-02-06 NOTE — ED Triage Notes (Signed)
Pt arrived reporting high blood sugar since yesterday. States has blood drawn at MD office and he was told to come to ED. Denies any other symptoms. Reports he has been complaint with his medications

## 2023-02-06 NOTE — ED Provider Notes (Signed)
[facility] AT [facility] Provider Note   CSN: [id number] Arrival date & time: 02/06/23  0825     History  Chief Complaint  Patient presents with   Hyperglycemia    [name] is a 72 y.o. male.  Patient is a 72 year old male with a history of diabetes, hypertension, hyperlipidemia, GERD and heart murmur who is presenting today due to worsening hyperglycemia.  Patient complains of polyuria polydipsia but denies any vomiting or abdominal pain.  He has been compliant with his metformin but reports he went to see his doctor yesterday and blood sugar was up to 700.  They given him a dose of insulin there but reports his blood care has still been high and his doctor called him and told him to go directly to the emergency room.  He denies recent fever or change in diet.  He has been compliant with his medication.  He denies any confusion.  No recent falls or trauma.  The history is provided by the patient.  Hyperglycemia      Home Medications Prior to Admission medications   Medication Sig Start Date End Date Taking? Authorizing Provider  aspirin 81 MG tablet Take 81 mg by mouth daily.    [provider]  atorvastatin (LIPITOR) 40 MG tablet Take 1 tablet (40 mg total) by mouth daily. 02/05/23   Ronnald Nian, MD  dapagliflozin propanediol (FARXIGA) 10 MG TABS tablet Take 1 tablet (10 mg total) by mouth daily before breakfast. 02/05/23   Ronnald Nian, MD  Enalapril-hydroCHLOROthiazide 5-12.5 MG tablet Take 1 tablet by mouth daily. 02/05/23   Ronnald Nian, MD  JENTADUETO XR 06-998 MG TB24 Take 1 tablet by mouth daily. 04/29/22 04/29/23  Ronnald Nian, MD  metFORMIN (GLUCOPHAGE) 1000 MG tablet TAKE 1 TABLET (1,000 MG TOTAL) BY MOUTH TWICE A DAY WITH FOOD Patient not taking: Reported on 02/05/2023 07/28/22   Ronnald Nian, MD  Multiple Vitamins-Minerals (MULTIVITAMIN WITH MINERALS) tablet Take 1 tablet by mouth daily.    [provider]  tadalafil (CIALIS) 20 MG tablet Take 1 tablet (20 mg total) by mouth daily as needed for erectile dysfunction. 02/05/23   Ronnald Nian, MD      Allergies    Patient has no known allergies.    Review of Systems   Review of Systems  Physical Exam Updated Vital Signs BP 132/72 (BP Location: Right Arm)   Pulse 68   Temp 98.2 F (36.8 C) (Oral)   Resp 18   Ht 5\' 11"  (1.803 m)   Wt 68 kg   SpO2 98%   BMI 20.92 kg/m  Physical Exam Vitals and nursing note reviewed.  Constitutional:      General: He is not in acute distress.    Appearance: He is well-developed.  HENT:     Head: Normocephalic and atraumatic.     Mouth/Throat:     Mouth: Mucous membranes are dry.  Eyes:     Conjunctiva/sclera: Conjunctivae normal.     Pupils: Pupils are equal, round, and reactive to light.  Cardiovascular:     Rate and Rhythm: Normal rate and regular rhythm.     Heart sounds: Murmur heard.  Pulmonary:     Effort: Pulmonary effort is normal. No respiratory distress.     Breath sounds: Normal breath sounds. No wheezing or rales.  Abdominal:     General: There is no distension.     Palpations: Abdomen is soft.  Tenderness: There is no abdominal tenderness. There is no guarding or rebound.  Musculoskeletal:        General: No tenderness. Normal range of motion.     Cervical back: Normal range of motion and neck supple.     Right lower leg: No edema.     Left lower leg: No edema.  Skin:    General: Skin is warm and dry.     Findings: No erythema or rash.  Neurological:     Mental Status: He is alert and oriented to person, place, and time.  Psychiatric:        Behavior: Behavior normal.     ED Results / Procedures / Treatments   Labs (all labs ordered are listed, but only abnormal results are displayed) Labs Reviewed  URINALYSIS, ROUTINE W REFLEX MICROSCOPIC - Abnormal; Notable for the following components:      Result Value   Color, Urine STRAW (*)    Glucose,  UA >=500 (*)    Hgb urine dipstick SMALL (*)    Ketones, ur 20 (*)    All other components within normal limits  BETA-HYDROXYBUTYRIC ACID - Abnormal; Notable for the following components:   Beta-Hydroxybutyric Acid 3.39 (*)    All other components within normal limits  BLOOD GAS, VENOUS - Abnormal; Notable for the following components:   pO2, Ven 53 (*)    Bicarbonate 28.8 (*)    Acid-Base Excess 2.4 (*)    All other components within normal limits  CBG MONITORING, ED - Abnormal; Notable for the following components:   Glucose-Capillary 544 (*)    All other components within normal limits  I-STAT CHEM 8, ED - Abnormal; Notable for the following components:   Sodium 127 (*)    Chloride 91 (*)    BUN 49 (*)    Creatinine, Ser 1.90 (*)    Glucose, Bld 606 (*)    All other components within normal limits  CBC  COMPREHENSIVE METABOLIC PANEL  CBG MONITORING, ED    EKG None  Radiology No results found.  Procedures Procedures    Medications Ordered in ED Medications  insulin regular, human (MYXREDLIN) 100 units/ 100 mL infusion (has no administration in time range)  lactated ringers infusion (has no administration in time range)  dextrose 5 % in lactated ringers infusion (has no administration in time range)  dextrose 50 % solution 0-50 mL (has no administration in time range)  lactated ringers bolus 1,000 mL (1,000 mLs Intravenous New Bag/Given 02/06/23 1051)    ED Course/ Medical Decision Making/ A&P                                 Medical Decision Making Amount and/or Complexity of Data Reviewed Labs: ordered. Decision-making details documented in ED Course.  Risk Prescription drug management. Decision regarding hospitalization.   Pt with multiple medical problems and comorbidities and presenting today with a complaint that caries a high risk for morbidity and mortality.  Here today with hyperglycemia.  Patient is currently just on oral medication for this but  did get a dose of insulin yesterday.  Patient also found by PCP to have new AKI yesterday as well as hyperglycemia with sugars in the 700 range.  Patient is not tachycardic, denies any recent infectious symptoms.  Concern for early developing DKA in the setting of persistent hyperglycemia.  However patient is not having significant symptoms at this time.  He does appear dehydrated.  I independently interpreted patient's labs and i-STAT today with a blood sugar of 606 with a creatinine of 1.9 which is above his baseline of 1.5 and hyponatremia of 127, CBC within normal limits, beta hydroxybutyric acid is elevated at 3.39.  Venous gas without evidence of DKA at this time. Patient started on IV fluids.  And Endo tool with insulin drip.  Will admit for further care.  Patient and family are comfortable with this plan. CRITICAL CARE Performed by: Mylinda Brook Total critical care time: 30 minutes Critical care time was exclusive of separately billable procedures and treating other patients. Critical care was necessary to treat or prevent imminent or life-threatening deterioration. Critical care was time spent personally by me on the following activities: development of treatment plan with patient and/or surrogate as well as nursing, discussions with consultants, evaluation of patient's response to treatment, examination of patient, obtaining history from patient or surrogate, ordering and performing treatments and interventions, ordering and review of laboratory studies, ordering and review of radiographic studies, pulse oximetry and re-evaluation of patient's condition.           Final Clinical Impression(s) / ED Diagnoses Final diagnoses:  Hyperglycemia  AKI (acute kidney injury) (HCC)  Dehydration    Rx / DC Orders ED Discharge Orders     None         Gwyneth Sprout, MD 02/06/23 1139

## 2023-02-07 DIAGNOSIS — E1159 Type 2 diabetes mellitus with other circulatory complications: Secondary | ICD-10-CM | POA: Diagnosis not present

## 2023-02-07 DIAGNOSIS — I152 Hypertension secondary to endocrine disorders: Secondary | ICD-10-CM

## 2023-02-07 DIAGNOSIS — E1169 Type 2 diabetes mellitus with other specified complication: Secondary | ICD-10-CM

## 2023-02-07 DIAGNOSIS — N179 Acute kidney failure, unspecified: Secondary | ICD-10-CM

## 2023-02-07 DIAGNOSIS — E1110 Type 2 diabetes mellitus with ketoacidosis without coma: Secondary | ICD-10-CM | POA: Diagnosis not present

## 2023-02-07 DIAGNOSIS — R7989 Other specified abnormal findings of blood chemistry: Secondary | ICD-10-CM

## 2023-02-07 DIAGNOSIS — E785 Hyperlipidemia, unspecified: Secondary | ICD-10-CM

## 2023-02-07 LAB — COMPREHENSIVE METABOLIC PANEL
ALT: 20 U/L (ref 0–44)
AST: 19 U/L (ref 15–41)
Albumin: 2.9 g/dL — ABNORMAL LOW (ref 3.5–5.0)
Alkaline Phosphatase: 71 U/L (ref 38–126)
Anion gap: 7 (ref 5–15)
BUN: 28 mg/dL — ABNORMAL HIGH (ref 8–23)
CO2: 28 mmol/L (ref 22–32)
Calcium: 8.9 mg/dL (ref 8.9–10.3)
Chloride: 98 mmol/L (ref 98–111)
Creatinine, Ser: 1.24 mg/dL (ref 0.61–1.24)
GFR, Estimated: >60 mL/min (ref >60)
Glucose, Bld: 167 mg/dL — ABNORMAL HIGH (ref 70–99)
Potassium: 3.6 mmol/L (ref 3.5–5.1)
Sodium: 133 mmol/L — ABNORMAL LOW (ref 135–145)
Total Bilirubin: 0.8 mg/dL (ref <1.2)
Total Protein: 6 g/dL — ABNORMAL LOW (ref 6.5–8.1)

## 2023-02-07 LAB — GLUCOSE, CAPILLARY
Glucose-Capillary: 121 mg/dL — ABNORMAL HIGH (ref 70–99)
Glucose-Capillary: 135 mg/dL — ABNORMAL HIGH (ref 70–99)
Glucose-Capillary: 148 mg/dL — ABNORMAL HIGH (ref 70–99)
Glucose-Capillary: 158 mg/dL — ABNORMAL HIGH (ref 70–99)
Glucose-Capillary: 185 mg/dL — ABNORMAL HIGH (ref 70–99)
Glucose-Capillary: 223 mg/dL — ABNORMAL HIGH (ref 70–99)
Glucose-Capillary: 228 mg/dL — ABNORMAL HIGH (ref 70–99)
Glucose-Capillary: 264 mg/dL — ABNORMAL HIGH (ref 70–99)
Glucose-Capillary: 446 mg/dL — ABNORMAL HIGH (ref 70–99)
Glucose-Capillary: 480 mg/dL — ABNORMAL HIGH (ref 70–99)

## 2023-02-07 LAB — CBC
HCT: 38.4 % — ABNORMAL LOW (ref 39.0–52.0)
Hemoglobin: 12.4 g/dL — ABNORMAL LOW (ref 13.0–17.0)
MCH: 27.6 pg (ref 26.0–34.0)
MCHC: 32.3 g/dL (ref 30.0–36.0)
MCV: 85.3 fL (ref 80.0–100.0)
Platelets: 233 10*3/uL (ref 150–400)
RBC: 4.5 MIL/uL (ref 4.22–5.81)
RDW: 12.8 % (ref 11.5–15.5)
WBC: 7.2 10*3/uL (ref 4.0–10.5)
nRBC: 0 % (ref 0.0–0.2)

## 2023-02-07 MED ORDER — HYDRALAZINE HCL 20 MG/ML IJ SOLN: 10.0000 mg | Freq: Four times a day (QID) | INTRAMUSCULAR | Status: DC | PRN

## 2023-02-07 MED ORDER — INSULIN ASPART 100 UNIT/ML IJ SOLN
8.0000 [IU] | Freq: Once | INTRAMUSCULAR | Status: AC
  Administered 2023-02-07: 8 [IU] via SUBCUTANEOUS

## 2023-02-07 MED ORDER — INSULIN ASPART 100 UNIT/ML IJ SOLN: 0.0000 [IU] | Freq: Every day | INTRAMUSCULAR | Status: DC

## 2023-02-07 MED ORDER — INSULIN GLARGINE-YFGN 100 UNIT/ML ~~LOC~~ SOLN
25.0000 [IU] | SUBCUTANEOUS | Status: DC
  Administered 2023-02-07 – 2023-02-08 (×2): 25 [IU] via SUBCUTANEOUS
  Filled 2023-02-07 (×3): qty 0.25

## 2023-02-07 MED ORDER — INSULIN ASPART 100 UNIT/ML IJ SOLN
0.0000 [IU] | Freq: Three times a day (TID) | INTRAMUSCULAR | Status: DC
  Administered 2023-02-07: 2 [IU] via SUBCUTANEOUS
  Administered 2023-02-07: 5 [IU] via SUBCUTANEOUS
  Administered 2023-02-07: 8 [IU] via SUBCUTANEOUS
  Administered 2023-02-08: 3 [IU] via SUBCUTANEOUS

## 2023-02-07 MED ORDER — DOCUSATE SODIUM 100 MG PO CAPS
100.0000 mg | ORAL_CAPSULE | Freq: Two times a day (BID) | ORAL | Status: DC
  Administered 2023-02-07 – 2023-02-08 (×2): 100 mg via ORAL
  Filled 2023-02-07 (×2): qty 1

## 2023-02-07 MED ORDER — POLYETHYLENE GLYCOL 3350 17 G PO PACK
17.0000 g | PACK | Freq: Every day | ORAL | Status: DC
  Administered 2023-02-07 – 2023-02-08 (×2): 17 g via ORAL
  Filled 2023-02-07 (×2): qty 1

## 2023-02-07 NOTE — Hospital Course (Addendum)
[name] is a 72 y.o. male with medical history significant of diverticulosis, hyperlipidemia, GERD, hypertension, presented to the emergency department referred by his PCP due to hyperglycemia.  He stated he has been having polyuria and polydipsia for the last 2 weeks despite use of Farxiga and  Jentadueto XR.  In the ED vitals were stable.  CBC within normal range.  Urinalysis showed 500 glucose and ketones.  Bicarb 28.  CMP showed glucose of 596 with creatinine of 1.8 with sodium of 137.  Beta hydroxybutyrate was elevated at 3.3..  Anion gap was 16.  Patient was given IV fluids started on insulin infusion and was considered for admission to hospital for further evaluation and treatment.   Assessment and Plan:    DKA, type 2  Received IV fluid with insulin drip.  Has been transitioned to long-acting insulin at this time.  Diabetic coordinator has been consulted.  POC hemoglobin A1c was more than 14,  2 days back.  Will recommend insulin on discharge.     Pseudohyponatremia Secondary to DKA.  Improved at this time.     AKI (acute kidney injury) on Stage 3a chronic kidney disease  Hold ARB ACE inhibitor's and diuretic.  Continue intake and output charting.  Received IV fluids.  Initial creatinine was 1.8.  Has improved to 1.2 at this time.     Hypertension associated with diabetes  Enalapril HCTZ on hold due to AKI.  As needed antihypertensive     Hyperlipidemia associated with type 2 diabetes mellitus  Continue Lipitor

## 2023-02-07 NOTE — Plan of Care (Signed)

## 2023-02-07 NOTE — Progress Notes (Signed)
PROGRESS NOTE    [name]  RUE:[id number] DOB: August 15, 1950 DOA: 02/06/2023 PCP: Ronnald Nian, MD    Brief Narrative:  [name] is a 72 y.o. male with medical history significant of diverticulosis, hyperlipidemia, GERD, hypertension, presented to the emergency department referred by his PCP due to hyperglycemia.  He stated he has been having polyuria and polydipsia for the last 2 weeks despite use of Farxiga and  Jentadueto XR.  In the ED vitals were stable.  CBC within normal range.  Urinalysis showed 500 glucose and ketones.  Bicarb 28.  CMP showed glucose of 596 with creatinine of 1.8 with sodium of 137.  Beta hydroxybutyrate was elevated at 3.3..  Anion gap was 16.  Patient was given IV fluids started on insulin infusion and was considered for admission to hospital for further evaluation and treatment.  Assessment and Plan:    DKA, type 2  Received IV fluid with insulin drip.  Has been transitioned to long-acting insulin at this time.  Diabetic coordinator has been consulted.  Spoke with the patient in detail.  He said that he was started on insulin pen and had taken only 1 time.  Looks like he was prescribed maybe 10 units.  He might need more units prior to discharge.  Also spoke with the patient's spouse at bedside.  POC hemoglobin A1c was more than 14,  2 days back.  Will recommend insulin on discharge.     Pseudohyponatremia Secondary to DKA.  Improved at this time.  Check BMP in AM.     AKI (acute kidney injury) on Stage 3a chronic kidney disease  Continue to hold ARB ACE inhibitor's and diuretic.  Continue intake and output charting.  Received IV fluids.  Initial creatinine was 1.8.  Has improved to 1.2 at this time.  Continue Ringer lactate for 1 more day.     Hypertension associated with diabetes  Enalapril HCTZ on hold due to AKI.  As needed antihypertensive     Hyperlipidemia associated with type 2 diabetes mellitus  Continue Lipitor      DVT  prophylaxis: enoxaparin (LOVENOX) injection 40 mg Start: 02/06/23 2200   Code Status:     Code Status: Full Code  Disposition: Home likely 02/08/2023. Status is: Inpatient Remains inpatient appropriate because: Diabetic ketoacidosis, needs insulin adjustment,   Family Communication: With the patient's spouse at bedside.  Consultants:  None  Procedures:  None  Antimicrobials:  None  Anti-infectives (From admission, onward)    None        Subjective: Today, patient was seen and examined at bedside.  States he feels okay.  Denies any nausea vomiting dizziness lightheadedness or shortness of breath.  Seen by diabetic coordinator.  Spoke about insulin  Objective: Vitals:   02/07/23 0405 02/07/23 0500 02/07/23 0600 02/07/23 0800  BP:   131/84   Pulse: 64  67   Resp: 14 13 12    Temp: 97.8 F (36.6 C)   97.6 F (36.4 C)  TempSrc: Oral   Axillary  SpO2: 98%  98%   Weight:      Height:        Intake/Output Summary (Last 24 hours) at 02/07/2023 0904 Last data filed at 02/07/2023 0707 Gross per 24 hour  Intake 3561.11 ml  Output --  Net 3561.11 ml   Filed Weights   02/06/23 0830  Weight: 68 kg    Physical Examination: Body mass index is 20.92 kg/m.  General:  Average built, not in  obvious distress HENT:   No scleral pallor or icterus noted. Oral mucosa is moist.  Chest:  Clear breath sounds. . No crackles or wheezes.  CVS: S1 &S2 heard. No murmur.  Regular rate and rhythm. Abdomen: Soft, nontender, nondistended.  Bowel sounds are heard.   Extremities: No cyanosis, clubbing or edema.  Peripheral pulses are palpable. Psych: Alert, awake and oriented, normal mood CNS:  No cranial nerve deficits.  Power equal in all extremities.   Skin: Warm and dry.  No rashes noted.  Data Reviewed:   CBC: Recent Labs  Lab 02/05/23 1241 02/06/23 0859 02/06/23 0913 02/07/23 0327  WBC 8.6 8.0  --  7.2  NEUTROABS 6.2  --   --   --   HGB 14.8 15.6 16.7 12.4*  HCT 48.1  47.5 49.0 38.4*  MCV 88 83.6  --  85.3  PLT 294 287  --  233    Basic Metabolic Panel: Recent Labs  Lab 02/06/23 0859 02/06/23 0913 02/06/23 1458 02/06/23 1708 02/06/23 2034 02/07/23 0327  NA 126* 127* 129* 127* 128* 133*  K 5.1 4.7 3.4* 3.1* 4.0 3.6  CL 86* 91* 93* 90* 91* 98  CO2 24  --  27 27 29 28   GLUCOSE 597* 606* 277* 204* 130* 167*  BUN 45* 49* 40* 36* 35* 28*  CREATININE 1.84* 1.90* 1.46* 1.38* 1.39* 1.24  CALCIUM 9.8  --  9.5 9.0 8.9 8.9  MG  --   --  2.2  --   --   --   PHOS  --   --  2.5  --   --   --     Liver Function Tests: Recent Labs  Lab 02/05/23 1241 02/06/23 0859 02/07/23 0327  AST 20 31 19   ALT 24 29 20   ALKPHOS 129* 102 71  BILITOT 0.4 1.1 0.8  PROT 7.1 8.0 6.0*  ALBUMIN 4.0 3.9 2.9*     Radiology Studies: No results found.    LOS: 1 day    Joycelyn Das, MD [facility] Available via Epic secure chat 7am-7pm After these hours, please refer to coverage provider listed on [email or web address] 02/07/2023, 9:04 AM

## 2023-02-08 DIAGNOSIS — N179 Acute kidney failure, unspecified: Secondary | ICD-10-CM | POA: Diagnosis not present

## 2023-02-08 DIAGNOSIS — E1169 Type 2 diabetes mellitus with other specified complication: Secondary | ICD-10-CM | POA: Diagnosis not present

## 2023-02-08 DIAGNOSIS — E1110 Type 2 diabetes mellitus with ketoacidosis without coma: Secondary | ICD-10-CM | POA: Diagnosis not present

## 2023-02-08 DIAGNOSIS — E1159 Type 2 diabetes mellitus with other circulatory complications: Secondary | ICD-10-CM | POA: Diagnosis not present

## 2023-02-08 LAB — GLUCOSE, CAPILLARY: Glucose-Capillary: 232 mg/dL — ABNORMAL HIGH (ref 70–99)

## 2023-02-08 LAB — CBC
HCT: 39.5 % (ref 39.0–52.0)
Hemoglobin: 12.6 g/dL — ABNORMAL LOW (ref 13.0–17.0)
MCH: 27.5 pg (ref 26.0–34.0)
MCHC: 31.9 g/dL (ref 30.0–36.0)
MCV: 86.2 fL (ref 80.0–100.0)
Platelets: 218 10*3/uL (ref 150–400)
RBC: 4.58 MIL/uL (ref 4.22–5.81)
RDW: 13.1 % (ref 11.5–15.5)
WBC: 6.1 10*3/uL (ref 4.0–10.5)
nRBC: 0 % (ref 0.0–0.2)

## 2023-02-08 LAB — BASIC METABOLIC PANEL
Anion gap: 7 (ref 5–15)
BUN: 26 mg/dL — ABNORMAL HIGH (ref 8–23)
CO2: 25 mmol/L (ref 22–32)
Calcium: 8.8 mg/dL — ABNORMAL LOW (ref 8.9–10.3)
Chloride: 101 mmol/L (ref 98–111)
Creatinine, Ser: 1.23 mg/dL (ref 0.61–1.24)
GFR, Estimated: >60 mL/min (ref >60)
Glucose, Bld: 211 mg/dL — ABNORMAL HIGH (ref 70–99)
Potassium: 3.9 mmol/L (ref 3.5–5.1)
Sodium: 133 mmol/L — ABNORMAL LOW (ref 135–145)

## 2023-02-08 LAB — MAGNESIUM: Magnesium: 2.1 mg/dL (ref 1.7–2.4)

## 2023-02-08 MED ORDER — INSULIN GLARGINE 100 UNIT/ML SOLOSTAR PEN: 25.0000 [IU] | PEN_INJECTOR | Freq: Every day | SUBCUTANEOUS | 11 refills | Status: DC

## 2023-02-08 NOTE — TOC Initial Note (Signed)
Transition of Care [facility]) - Initial/Assessment Note    Patient Details  Name: [name] MRN: [id number] Date of Birth: 1950/09/06  Transition of Care [facility]) CM/SW Contact:    Adrian Prows, RN Phone Number: 02/08/2023, 9:32 AM  Clinical Narrative:                 Spoke w/ pt and wife [name] in room; pt says he lives at home; he plans to return at d/c; pt verified insurance/PCP; he has transportation; he denies SDOH risks; pt says he does not have DME, HH services, or home oxygen; no TOC needs.  Expected Discharge Plan: Home/Self Care Barriers to Discharge: No Barriers Identified   Patient Goals and CMS Choice Patient states their goals for this hospitalization and ongoing recovery are:: home CMS Medicare.gov Compare Post Acute Care list provided to:: Patient        Expected Discharge Plan and Services   Discharge Planning Services: CM Consult   Living arrangements for the past 2 months: Single Family Home Expected Discharge Date: 02/08/23               DME Arranged: N/A DME Agency: NA       HH Arranged: NA HH Agency: NA        Prior Living Arrangements/Services Living arrangements for the past 2 months: Single Family Home Lives with:: Spouse Patient language and need for interpreter reviewed:: Yes Do you feel safe going back to the place where you live?: Yes      Need for Family Participation in Patient Care: Yes (Comment) Care giver support system in place?: Yes (comment) Current home services:  (n/a) Criminal Activity/Legal Involvement Pertinent to Current Situation/Hospitalization: No - Comment as needed  Activities of Daily Living   ADL Screening (condition at time of admission) Independently performs ADLs?: Yes (appropriate for developmental age) Is the patient deaf or have difficulty hearing?: No Does the patient have difficulty seeing, even when wearing glasses/contacts?: No Does the patient have difficulty concentrating,  remembering, or making decisions?: No  Permission Sought/Granted Permission sought to share information with : Case Manager Permission granted to share information with : Yes, Verbal Permission Granted  Share Information with NAME: Case Manager     Permission granted to share info w Relationship: [name] (spouse) [phone]     Emotional Assessment Appearance:: Appears stated age Attitude/Demeanor/Rapport: Gracious Affect (typically observed): Accepting Orientation: : Oriented to Self, Oriented to Place, Oriented to  Time, Oriented to Situation Alcohol / Substance Use: Illicit Drugs Psych Involvement: No (comment)  Admission diagnosis:  Dehydration [E86.0] Hyperglycemia [R73.9] AKI (acute kidney injury) (HCC) [N17.9] DKA, type 2 (HCC) [E11.10] Patient Active Problem List   Diagnosis Date Noted   AKI (acute kidney injury) (HCC) 02/06/2023   DKA, type 2 (HCC) 02/06/2023   Pseudohyponatremia 02/06/2023   Stage 3a chronic kidney disease (HCC) 12/14/2019   Family history of colon cancer 08/30/2019   Adenomatous polyp of colon 08/05/2017   Former smoker, stopped smoking in distant past 11/10/2013   ED (erectile dysfunction) of organic origin 02/09/2012   Diabetes mellitus type 2, controlled (HCC) 10/15/2010   Hypertension associated with diabetes (HCC) 10/15/2010   Hyperlipidemia associated with type 2 diabetes mellitus (HCC) 10/15/2010   PCP:  Ronnald Nian, MD Pharmacy:   [facility]) [place], [place] - [address] [facility] [address] [place] [address] [place]  [address] Phone: [phone] Fax: [phone]  [facility] [phone] [place], [place] -  [address] AT [facility] [address] [place] [address] Phone: [phone] Fax: [phone]  [facility] - [place], [place] - [address] [place] [address] Phone: [phone] Fax: [phone]  [facility] - [place], [place] - [address] [place]  [address] Phone: [phone] Fax: [phone]     Social Drivers of Health (SDOH) Social History: SDOH Screenings   Food Insecurity: No Food Insecurity (02/08/2023)  Housing: Low Risk  (02/08/2023)  Transportation Needs: No Transportation Needs (02/08/2023)  Utilities: Not At Risk (02/08/2023)  Depression (PHQ2-9): Low Risk  (11/19/2021)  Tobacco Use: Medium Risk (02/06/2023)   SDOH Interventions: Food Insecurity Interventions: Intervention Not Indicated, Inpatient TOC Housing Interventions: Intervention Not Indicated, Inpatient TOC Transportation Interventions: Intervention Not Indicated, Inpatient TOC Utilities Interventions: Intervention Not Indicated, Inpatient TOC   Readmission Risk Interventions     No data to display

## 2023-02-08 NOTE — Plan of Care (Signed)
Pt did have a fsbs of over 400 this shift. Hospitalist notified and 8 units of insulin given. Pt says he may have eaten too many carbs, denies having anything sweet. Problem: Education: Goal: Ability to describe self-care measures that may prevent or decrease complications (Diabetes Survival Skills Education) will improve Outcome: Progressing Goal: Individualized Educational Video(s) Outcome: Progressing   Problem: Coping: Goal: Ability to adjust to condition or change in health will improve Outcome: Progressing   Problem: Fluid Volume: Goal: Ability to maintain a balanced intake and output will improve Outcome: Progressing   Problem: Health Behavior/Discharge Planning: Goal: Ability to identify and utilize available resources and services will improve Outcome: Progressing Goal: Ability to manage health-related needs will improve Outcome: Progressing   Problem: Metabolic: Goal: Ability to maintain appropriate glucose levels will improve Outcome: Progressing   Problem: Nutritional: Goal: Maintenance of adequate nutrition will improve Outcome: Progressing Goal: Progress toward achieving an optimal weight will improve Outcome: Progressing   Problem: Skin Integrity: Goal: Risk for impaired skin integrity will decrease Outcome: Progressing   Problem: Tissue Perfusion: Goal: Adequacy of tissue perfusion will improve Outcome: Progressing   Problem: Education: Goal: Ability to describe self-care measures that may prevent or decrease complications (Diabetes Survival Skills Education) will improve Outcome: Progressing Goal: Individualized Educational Video(s) Outcome: Progressing   Problem: Cardiac: Goal: Ability to maintain an adequate cardiac output will improve Outcome: Progressing   Problem: Health Behavior/Discharge Planning: Goal: Ability to identify and utilize available resources and services will improve Outcome: Progressing Goal: Ability to manage health-related  needs will improve Outcome: Progressing   Problem: Fluid Volume: Goal: Ability to achieve a balanced intake and output will improve Outcome: Progressing   Problem: Metabolic: Goal: Ability to maintain appropriate glucose levels will improve Outcome: Progressing   Problem: Nutritional: Goal: Maintenance of adequate nutrition will improve Outcome: Progressing Goal: Maintenance of adequate weight for body size and type will improve Outcome: Progressing   Problem: Respiratory: Goal: Will regain and/or maintain adequate ventilation Outcome: Progressing   Problem: Urinary Elimination: Goal: Ability to achieve and maintain adequate renal perfusion and functioning will improve Outcome: Progressing   Problem: Education: Goal: Knowledge of General Education information will improve Description: Including pain rating scale, medication(s)/side effects and non-pharmacologic comfort measures Outcome: Progressing   Problem: Health Behavior/Discharge Planning: Goal: Ability to manage health-related needs will improve Outcome: Progressing   Problem: Clinical Measurements: Goal: Ability to maintain clinical measurements within normal limits will improve Outcome: Progressing Goal: Will remain free from infection Outcome: Progressing Goal: Diagnostic test results will improve Outcome: Progressing Goal: Respiratory complications will improve Outcome: Progressing Goal: Cardiovascular complication will be avoided Outcome: Progressing   Problem: Activity: Goal: Risk for activity intolerance will decrease Outcome: Progressing   Problem: Nutrition: Goal: Adequate nutrition will be maintained Outcome: Progressing   Problem: Coping: Goal: Level of anxiety will decrease Outcome: Progressing   Problem: Elimination: Goal: Will not experience complications related to bowel motility Outcome: Progressing Goal: Will not experience complications related to urinary retention Outcome:  Progressing   Problem: Pain Management: Goal: General experience of comfort will improve Outcome: Progressing   Problem: Safety: Goal: Ability to remain free from injury will improve Outcome: Progressing   Problem: Skin Integrity: Goal: Risk for impaired skin integrity will decrease Outcome: Progressing

## 2023-02-08 NOTE — Discharge Summary (Signed)
Physician Discharge Summary  KADARIAN ARIAIL NWG:[id number] DOB: Oct 24, 1950 DOA: 02/06/2023  PCP: Ronnald Nian, MD  Admit date: 02/06/2023 Discharge date: 02/08/2023  Admitted From: Home  Discharge disposition: Home   Recommendations for Outpatient Follow-Up:   Follow up with your primary care provider in one week.  Patient would benefit from endocrinology follow-up as outpatient.  Has been started on higher dose of long-acting insulin while in the hospital and Marcelline Deist has been discontinued.  Please adjust insulin and other medications as outpatient.   Would benefit from extensive diabetic education as outpatient Check CBC, BMP, magnesium in the next visit   Discharge Diagnosis:   Principal Problem:   DKA, type 2 (HCC) Active Problems:   Hypertension associated with diabetes (HCC)   Hyperlipidemia associated with type 2 diabetes mellitus (HCC)   Stage 3a chronic kidney disease (HCC)   AKI (acute kidney injury) (HCC)   Pseudohyponatremia   Discharge Condition: Improved.  Diet recommendation:   Carbohydrate-modified.   Wound care: None.  Code status: Full.   History of Present Illness:   [name] is a 72 y.o. male with medical history significant of diverticulosis, hyperlipidemia, GERD, hypertension, presented to the emergency department referred by his PCP due to hyperglycemia.  He stated he has been having polyuria and polydipsia for the last 2 weeks despite use of Farxiga and  Jentadueto XR.  In the ED vitals were stable.  CBC within normal range.  Urinalysis showed 500 glucose and ketones.  Bicarb 28.  CMP showed glucose of 596 with creatinine of 1.8 with sodium of 137.  Beta hydroxybutyrate was elevated at 3.3..  Anion gap was 16.  Patient was given IV fluids started on insulin infusion and was considered for admission to hospital for further evaluation and treatment.    Hospital Course:   Following conditions were addressed during hospitalization  as listed below,  DKA, type 2  Received IV fluid with insulin drip initially and was transitioned to long-acting insulin and sliding scale during hospitalization.  Patient stated that he was on insulin at home as prescribed by his primary care provider but had not been listed in his home medication list.  He knew how to do insulin but at this time patient will have to increase the doses of insulin to 25 units daily.  Spoke with the patient at length regarding lifestyle modification blood glucose monitoring..  Diabetic coordinator also saw the patient during hospitalization.Marland Kitchen   POC hemoglobin A1c was more than 14, strongly recommend dietary education as outpatient.    Pseudohyponatremia Secondary to DKA.  Improved at this time.  Latest sodium prior to discharge was 133.     AKI (acute kidney injury) on Stage 3a chronic kidney disease  Improved and renal function at baseline at this time.  Creatinine of 1.2 today.  Patient will be reinitiated on enalapril HCTZ from home on discharge.     Hypertension associated with diabetes  Will resume enalapril HCTZ on discharge.    Hyperlipidemia associated with type 2 diabetes mellitus  Continue Lipitor  Disposition.  At this time, patient is stable for disposition home with outpatient PCP follow-up.  Spoke with the patient's wife at length prior to disposition  Medical Consultants:   None.  Procedures:    None Subjective:   Today, patient was seen and examined at bedside.  Feels okay.  Denies any nausea vomiting fever chills or rigor.  States that he does not have much education and diabetes.  Was  encouraged to discuss this with his primary care physician as well on discharge.  Discharge Exam:   Vitals:   02/07/23 1917 02/08/23 0550  BP: 123/70 128/78  Pulse: 79 67  Resp: 18 20  Temp: 98.2 F (36.8 C) 97.9 F (36.6 C)  SpO2: 98% 97%   Vitals:   02/07/23 1400 02/07/23 1500 02/07/23 1917 02/08/23 0550  BP:   123/70 128/78  Pulse: 72 69  79 67  Resp: 15 12 18 20   Temp:   98.2 F (36.8 C) 97.9 F (36.6 C)  TempSrc:      SpO2: 98% 100% 98% 97%  Weight:      Height:       Body mass index is 20.92 kg/m.  General: Alert awake, not in obvious distress HENT: pupils equally reacting to light,  No scleral pallor or icterus noted. Oral mucosa is moist.  Chest:  Clear breath sounds.  . No crackles or wheezes.  CVS: S1 &S2 heard. No murmur.  Regular rate and rhythm. Abdomen: Soft, nontender, nondistended.  Bowel sounds are heard.   Extremities: No cyanosis, clubbing or edema.  Peripheral pulses are palpable. Psych: Alert, awake and oriented, normal mood CNS:  No cranial nerve deficits.  Power equal in all extremities.   Skin: Warm and dry.  No rashes noted.  The results of significant diagnostics from this hospitalization (including imaging, microbiology, ancillary and laboratory) are listed below for reference.     Diagnostic Studies:   No results found.   Labs:   Basic Metabolic Panel: Recent Labs  Lab 02/06/23 1458 02/06/23 1708 02/06/23 2034 02/07/23 0327 02/08/23 0535  NA 129* 127* 128* 133* 133*  K 3.4* 3.1* 4.0 3.6 3.9  CL 93* 90* 91* 98 101  CO2 27 27 29 28 25   GLUCOSE 277* 204* 130* 167* 211*  BUN 40* 36* 35* 28* 26*  CREATININE 1.46* 1.38* 1.39* 1.24 1.23  CALCIUM 9.5 9.0 8.9 8.9 8.8*  MG 2.2  --   --   --  2.1  PHOS 2.5  --   --   --   --    GFR Estimated Creatinine Clearance: 53 mL/min (by C-G formula based on SCr of 1.23 mg/dL). Liver Function Tests: Recent Labs  Lab 02/05/23 1241 02/06/23 0859 02/07/23 0327  AST 20 31 19   ALT 24 29 20   ALKPHOS 129* 102 71  BILITOT 0.4 1.1 0.8  PROT 7.1 8.0 6.0*  ALBUMIN 4.0 3.9 2.9*   No results for input(s): "LIPASE", "AMYLASE" in the last 168 hours. No results for input(s): "AMMONIA" in the last 168 hours. Coagulation profile No results for input(s): "INR", "PROTIME" in the last 168 hours.  CBC: Recent Labs  Lab 02/05/23 1241  02/06/23 0859 02/06/23 0913 02/07/23 0327 02/08/23 0535  WBC 8.6 8.0  --  7.2 6.1  NEUTROABS 6.2  --   --   --   --   HGB 14.8 15.6 16.7 12.4* 12.6*  HCT 48.1 47.5 49.0 38.4* 39.5  MCV 88 83.6  --  85.3 86.2  PLT 294 287  --  233 218   Cardiac Enzymes: No results for input(s): "CKTOTAL", "CKMB", "CKMBINDEX", "TROPONINI" in the last 168 hours. BNP: Invalid input(s): "POCBNP" CBG: Recent Labs  Lab 02/07/23 1159 02/07/23 1649 02/07/23 2111 02/07/23 2142 02/08/23 0728  GLUCAP 264* 228* 446* 480* 232*   D-Dimer No results for input(s): "DDIMER" in the last 72 hours. Hgb A1c Recent Labs    02/05/23 1155  HGBA1C >  14   Lipid Profile Recent Labs    02/05/23 1241  CHOL 191  HDL 39*  LDLCALC 95  TRIG 244*  CHOLHDL 4.9   Thyroid function studies No results for input(s): "TSH", "T4TOTAL", "T3FREE", "THYROIDAB" in the last 72 hours.  Invalid input(s): "FREET3" Anemia work up No results for input(s): "VITAMINB12", "FOLATE", "FERRITIN", "TIBC", "IRON", "RETICCTPCT" in the last 72 hours. Microbiology No results found for this or any previous visit (from the past 240 hours).   Discharge Instructions:   Discharge Instructions     Call MD for:  persistant nausea and vomiting   Complete by: As directed    Call MD for:  temperature >100.4   Complete by: As directed    Diet Carb Modified   Complete by: As directed    Discharge instructions   Complete by: As directed    Follow-up with your primary care provider this Tuesday for follow up. Continue taking insulin at home scribed..  Please keep a log of your blood glucose levels at home especially fasting and 2 hours after meal.   You will need better adjustment of diabetic medication/ insulin as outpatient.  Increase hydration.Seek medical attention for worsening symptoms.   Increase activity slowly   Complete by: As directed       Allergies as of 02/08/2023   No Known Allergies      Medication List     STOP  taking these medications    dapagliflozin propanediol 10 MG Tabs tablet Commonly known as: Farxiga       TAKE these medications    aspirin 81 MG tablet Take 81 mg by mouth daily.   atorvastatin 40 MG tablet Commonly known as: LIPITOR Take 1 tablet (40 mg total) by mouth daily.   Enalapril-hydroCHLOROthiazide 5-12.5 MG tablet Take 1 tablet by mouth daily.   insulin glargine 100 UNIT/ML Solostar Pen Commonly known as: LANTUS Inject 25 Units into the skin at bedtime.   Jentadueto XR 06-998 MG Tb24 Generic drug: linaGLIPtin-metFORMIN HCl ER Take 1 tablet by mouth daily.   multivitamin with minerals tablet Take 1 tablet by mouth daily.   tadalafil 20 MG tablet Commonly known as: Cialis Take 1 tablet (20 mg total) by mouth daily as needed for erectile dysfunction.        Follow-up Information     Ronnald Nian, MD Follow up in 1 week(s).   Specialty: Family Medicine Contact information: [address] [place] [address] [phone]                  Time coordinating discharge: 39 minutes  Signed:  Latoiya Maradiaga  [facility] 02/08/2023, 9:52 AM

## 2023-02-08 NOTE — Discharge Instructions (Signed)
reso 

## 2023-02-08 NOTE — Progress Notes (Signed)
Provided AVS and DM Education

## 2023-02-10 ENCOUNTER — Encounter: Payer: Self-pay | Admitting: Family Medicine

## 2023-02-10 ENCOUNTER — Ambulatory Visit: Payer: Medicare Other | Admitting: Family Medicine

## 2023-02-10 VITALS — BP 110/70 | HR 90 | Ht 71.0 in | Wt 159.6 lb

## 2023-02-10 DIAGNOSIS — E1165 Type 2 diabetes mellitus with hyperglycemia: Secondary | ICD-10-CM | POA: Diagnosis not present

## 2023-02-10 LAB — POCT CBG (FASTING - GLUCOSE)-MANUAL ENTRY: Glucose Fasting, POC: 179 mg/dL — AB (ref 70–99)

## 2023-02-10 MED ORDER — FREESTYLE LIBRE 3 READER DEVI: 0 refills | Status: AC

## 2023-02-10 MED ORDER — SOLIQUA 100-33 UNT-MCG/ML ~~LOC~~ SOPN: 15.0000 [IU] | PEN_INJECTOR | Freq: Every day | SUBCUTANEOUS | 1 refills | Status: DC

## 2023-02-10 MED ORDER — FREESTYLE LIBRE 3 SENSOR MISC: 3 refills | Status: DC

## 2023-02-10 NOTE — Patient Instructions (Addendum)
Stop the Jentadueto.  Start taking the [place] again and the metformin.  Start taking the [facility] which is the injectable.  Take 15 units daily Check your blood sugars several times per day either before a meal or 2 hours after meal until we can get the reader device that we will check your blood sugars automatic

## 2023-02-10 NOTE — Progress Notes (Signed)
   Subjective:    Patient ID: [name], male    DOB: 16-Apr-1950, 72 y.o.   MRN: [id number]  HPI He is here for a posthospital follow-up.  He was admitted on 12/13 and treated for DKA.  1 day prior to that in my office his hemoglobin A1c skyrocketed to over 14 from a previous level of 7.  He had not changed any of his medications but did have polyuria and polydipsia.  He did go to the hospital and was subsequently admitted and IV hydration as well as insulin was instituted.  He responded well to this and was sent home on insulin long-acting at 25 units.  He has been checking his blood sugars intermittently since then.   Review of Systems     Objective:    Physical Exam Alert and in no distress.  The admission was started and blood work was evaluated.       Assessment & Plan:  Uncontrolled type 2 diabetes mellitus with hyperglycemia (HCC) - Plan: Insulin Glargine-Lixisenatide (SOLIQUA) 100-33 UNT-MCG/ML SOPN, Glucose (CBG), Fasting Stop the Jentadueto.  Start taking the [place] again and the metformin.  Start taking the [facility] which is the injectable.  Take 15 units daily Check your blood sugars several times per day either before a meal or 2 hours after meal until we can get the reader device that we will check your blood sugars automatically.  Once we get him set up on the CGM I will fine-tune him on the [place].

## 2023-02-12 ENCOUNTER — Other Ambulatory Visit: Payer: Self-pay

## 2023-02-12 ENCOUNTER — Telehealth: Payer: Self-pay | Admitting: Family Medicine

## 2023-02-12 DIAGNOSIS — E1165 Type 2 diabetes mellitus with hyperglycemia: Secondary | ICD-10-CM

## 2023-02-12 MED ORDER — SOLIQUA 100-33 UNT-MCG/ML ~~LOC~~ SOPN: 15.0000 [IU] | PEN_INJECTOR | Freq: Every day | SUBCUTANEOUS | 1 refills | Status: DC

## 2023-02-12 NOTE — Telephone Encounter (Signed)
Called pt, no answer, left voicemail. Sent rx to [facility].

## 2023-02-12 NOTE — Telephone Encounter (Signed)
Wife called,  Pt's soliqua rx was sent to mail order   - can you send rx to local pharmacy, [facility]  May be good idea to review what he is supposed to be taking with one of them, there seemed to be some confusion

## 2023-02-16 ENCOUNTER — Telehealth: Payer: Self-pay | Admitting: Family Medicine

## 2023-02-16 ENCOUNTER — Encounter: Payer: Self-pay | Admitting: Family Medicine

## 2023-02-16 ENCOUNTER — Ambulatory Visit (INDEPENDENT_AMBULATORY_CARE_PROVIDER_SITE_OTHER): Payer: Medicare Other | Admitting: Family Medicine

## 2023-02-16 VITALS — BP 116/70 | HR 78 | Wt 157.4 lb

## 2023-02-16 DIAGNOSIS — E1165 Type 2 diabetes mellitus with hyperglycemia: Secondary | ICD-10-CM

## 2023-02-16 NOTE — Progress Notes (Signed)
   Subjective:    Patient ID: [name], male    DOB: [birth year], 72 y.o.   MRN: [id number]  HPI He is here for consult concerning starting freestyle libre.  He did obtain all the equipment from the drugstore and is here with his wife.   Review of Systems     Objective:    Physical Exam Alert and in no distress otherwise not examined.  The device was placed on his left arm.       Assessment & Plan:  Uncontrolled type 2 diabetes mellitus with hyperglycemia (HCC) My CMA helped apply the first unit and explained how to set this up and use it on the reader as well as on his wife's phone since he does not have a phone that would connect.  Discussed continuing on 15 units of Soliqua and monitoring the blood sugars.  We will increase his Soliqua based on his blood sugar readings.

## 2023-02-16 NOTE — Telephone Encounter (Signed)
Pt forgot to ask if he should still be taking his asprin or not?

## 2023-02-27 ENCOUNTER — Other Ambulatory Visit: Payer: Self-pay

## 2023-02-27 ENCOUNTER — Telehealth: Payer: Self-pay

## 2023-02-27 MED ORDER — INSULIN PEN NEEDLE 32G X 4 MM MISC: 1.0000 | Freq: Every day | 2 refills | Status: DC

## 2023-02-27 NOTE — Telephone Encounter (Signed)
 Spoke with Mrs. Carriker. She did not have exact numbers for the pt's blood sugar. States it's around 150 in the morning before eating and then goes up to 250-300 after he eats. She is coming in on Monday to give me the device so I can print off his report for you.   Also requested insulin  [facility] [address]

## 2023-03-01 NOTE — Telephone Encounter (Signed)
 done

## 2023-03-02 ENCOUNTER — Other Ambulatory Visit: Payer: Medicare Other

## 2023-03-19 ENCOUNTER — Ambulatory Visit: Payer: Medicare Other | Admitting: Family Medicine

## 2023-03-19 ENCOUNTER — Telehealth: Payer: Self-pay

## 2023-03-19 NOTE — Telephone Encounter (Signed)
Wife called back & states they have lost applic, will reprint & pt to sign at appt on 1/30.  Also states the Chilo cost $100 & has only lasted a little over a month.  Will see what meds prescriber keeps him on at his appt & see if there is anything else pt might qualify for .

## 2023-03-19 NOTE — Telephone Encounter (Signed)
Following up on pt PAP AZ&ME Marcelline Deist and Jentadueto, pt has not mail back application or return to Dr Conrad [place] twice on pt cell pt did not answer, will follow up.

## 2023-03-24 NOTE — Telephone Encounter (Signed)
Will follow up after Dr appt on 1/30 due to not knowing if they will keep pt on this meds.

## 2023-03-26 ENCOUNTER — Telehealth: Payer: Self-pay

## 2023-03-26 ENCOUNTER — Encounter: Payer: Self-pay | Admitting: Family Medicine

## 2023-03-26 ENCOUNTER — Ambulatory Visit: Payer: Medicare Other | Admitting: Family Medicine

## 2023-03-26 VITALS — BP 112/70 | HR 81 | Wt 158.6 lb

## 2023-03-26 DIAGNOSIS — E1165 Type 2 diabetes mellitus with hyperglycemia: Secondary | ICD-10-CM | POA: Diagnosis not present

## 2023-03-26 DIAGNOSIS — Z794 Long term (current) use of insulin: Secondary | ICD-10-CM | POA: Diagnosis not present

## 2023-03-26 DIAGNOSIS — E118 Type 2 diabetes mellitus with unspecified complications: Secondary | ICD-10-CM

## 2023-03-26 MED ORDER — SOLIQUA 100-33 UNT-MCG/ML ~~LOC~~ SOPN: PEN_INJECTOR | SUBCUTANEOUS | 1 refills | Status: DC

## 2023-03-26 NOTE — Progress Notes (Signed)
   03/26/2023  Patient ID: [name], male   DOB: 04-27-1950, 73 y.o.   MRN: [id number]  Patient assistance application for Farxiga (AZ&Me) completed and submitted via fax. Pending company response.  PAP for Soliqua [employer]) completed and submitted via fax. Pending company response.  Sherrill Raring, PharmD Clinical Pharmacist [phone]

## 2023-03-26 NOTE — Progress Notes (Signed)
  Subjective:    Patient ID: [name], male    DOB: May 20, 1950, 73 y.o.   MRN: [id number]  [name] is a 73 y.o. male who presents for follow-up of Type 2 diabetes mellitus.  Patient is checking home blood sugars.   Home blood sugar records: BGs range between 70 and 257 How often is blood sugars being checked: CGM Current symptoms/problems include none and have been stable. Daily foot checks: yes   Any foot concerns: none Last eye exam: May 2024 Exercise: Home exercise routine includes walking 1 hrs per week and yard work. He is now on 17 units of [place].  He is now resuming his normal activities and does like to do a lot of fishing. The following portions of the patient's history were reviewed and updated as appropriate: allergies, current medications, past medical history, past social history and problem list.  ROS as in subjective above.     Objective:    Physical Exam Alert and in no distress otherwise not examined. The AGP report was reviewed with him.  It does indicate that his evening meal seems to be the 1 set because the most trouble.  I went over this in detail with him explaining that if he sees his blood sugar go high, his next thought process should be what did he just eat and how can he adjust the next time he has that meal to have better numbers.  Also discussed that if he gets an alarm for low blood sugar, to check his fingerstick blood sugar to verify it as it could also be in placement of the sensor. The AGP report shows a GMI of 6.9. Lab Review    Latest Ref Rng & Units 02/08/2023    5:35 AM 02/07/2023    3:27 AM 02/06/2023    8:34 PM 02/06/2023    5:08 PM 02/06/2023    2:58 PM  Diabetic Labs  Creatinine 0.61 - 1.24 mg/dL 4.09  8.11  9.14  7.82  1.46       03/26/2023   10:16 AM 02/16/2023   12:20 PM 02/10/2023    1:35 PM 02/08/2023    5:50 AM 02/07/2023    7:17 PM  BP/Weight  Systolic BP 112 116 110 128 123  Diastolic BP 70 70 70 78 70   Wt. (Lbs) 158.6 157.4 159.6    BMI 22.12 kg/m2 21.95 kg/m2 22.26 kg/m2        Latest Ref Rng & Units 08/13/2022   12:00 AM 10/01/2021   12:00 AM  Foot/eye exam completion dates  Eye Exam No Retinopathy No Retinopathy     No Retinopathy         This result is from an external source.    [name]  reports that he quit smoking about 42 years ago. His smoking use included cigarettes. He has never used smokeless tobacco. He reports current drug use. Drug: Marijuana. He reports that he does not drink alcohol.     Assessment & Plan:   Controlled type 2 diabetes mellitus with complication, with long-term current use of insulin (HCC) He will continue on his present 17 units daily and I again reinforced the need for him to look at the numbers if it is too high to readjust his eating habits next time around or if is too low to make sure that he verifies it with a fingerstick blood sugar Recheck 4 months

## 2023-03-27 NOTE — Telephone Encounter (Signed)
Following up on pt PAP AZ&ME [name], per Marylene Land [facility] pt drop off application at Dr office and has submitted to company and waiting on company's answer.

## 2023-04-02 ENCOUNTER — Telehealth: Payer: Self-pay

## 2023-04-02 NOTE — Progress Notes (Signed)
   04/02/2023  Patient ID: [name], male   DOB: 07/31/1950, 73 y.o.   MRN: [id number]  Attempted to contact patient to notify Farxiga  has been approved through PAP for 2025, Soliqua  app still pending. Also wanting to discuss insulin  running out early. Left HIPAA compliant message for patient to return my call at their convenience.

## 2023-04-03 ENCOUNTER — Other Ambulatory Visit: Payer: Self-pay

## 2023-04-03 ENCOUNTER — Encounter: Payer: Self-pay | Admitting: Family Medicine

## 2023-04-03 DIAGNOSIS — E1129 Type 2 diabetes mellitus with other diabetic kidney complication: Secondary | ICD-10-CM

## 2023-04-03 NOTE — Progress Notes (Signed)
   04/03/2023  Patient ID: [name], male   DOB: June 03, 1950, 73 y.o.   MRN: [id number]  Patient called in stating he was running out of his insulin  early. Requested he come in to go over how the pens are being used and to inspect the pens for any issues.  Soliqua  has a unique display for insulin  units, showing 0-2 and then the screen goes black while you turn until you hit 15 units. This is to allow for a 2 unit priming dose for the first use of the pen, and then the insulin  starting dose is to be at least 15 units.  Reviewed this information with patient's spouse who presented in office on patient's behalf. She expressed understanding and will reach out if any other questions or concerns.  Jon VEAR Lindau, PharmD Clinical Pharmacist [phone]

## 2023-04-16 ENCOUNTER — Telehealth: Payer: Self-pay

## 2023-04-16 DIAGNOSIS — E1129 Type 2 diabetes mellitus with other diabetic kidney complication: Secondary | ICD-10-CM

## 2023-04-16 NOTE — Progress Notes (Signed)
   04/16/2023  Patient ID: [name], male   DOB: 1950-03-07, 73 y.o.   MRN: [id number]  Contacted patient to confirm he was no longer having issues with his Lawana Chambers and had received his farxiga shipment for 2025.  Patient denies any issues at this time and reports he has both medications on hand and is taking.  Declines scheduling a follow up call, will call if any other concerns.  Sherrill Raring, PharmD Clinical Pharmacist [phone]

## 2023-04-16 NOTE — Telephone Encounter (Signed)
Copied from CRM [id number]. Topic: General - Other >> Apr 16, 2023  4:38 PM Antony Haste wrote: Reason for CRM: PT's wife returning missed call from Dr [facility], consulted with CAL, advised Marylene Land will be back tomorrow. His wife, Jacinto Halim states she is available after 2:00p for a callback due to work hours. Callback [phone]

## 2023-04-17 ENCOUNTER — Telehealth: Payer: Self-pay

## 2023-04-17 NOTE — Progress Notes (Signed)
   04/17/2023  Patient ID: [name], male   DOB: December 12, 1950, 73 y.o.   MRN: [id number]  Attempted to contact patient to return a missed call. Left HIPAA compliant message for patient to return my call at their convenience.   Sherrill Raring, PharmD Clinical Pharmacist [phone]

## 2023-05-13 ENCOUNTER — Other Ambulatory Visit: Payer: Self-pay | Admitting: Family Medicine

## 2023-05-13 MED ORDER — FREESTYLE LIBRE 3 SENSOR MISC: 3 refills | Status: DC

## 2023-05-21 ENCOUNTER — Other Ambulatory Visit: Payer: Self-pay | Admitting: Family Medicine

## 2023-05-21 DIAGNOSIS — E118 Type 2 diabetes mellitus with unspecified complications: Secondary | ICD-10-CM

## 2023-06-23 ENCOUNTER — Ambulatory Visit: Payer: Self-pay | Admitting: *Deleted

## 2023-06-23 NOTE — Telephone Encounter (Signed)
 Pts wife called stating someone called to possibly work pt into appt schedule: called CAL spoke with front desk and transferred call.

## 2023-06-23 NOTE — Telephone Encounter (Signed)
  Chief Complaint: boil/cyst on back Symptoms: quarter size cyst- sore inflamed, odor with discharge- not draining now Frequency: 1 month Pertinent Negatives: Patient denies fever Disposition: [] ED /[] Urgent Care (no appt availability in office) / [] Appointment(In office/virtual)/ []  [facility]/ [] Home Care/ [] Refused Recommended Disposition /[] [facility]/ [x]  Follow-up with PCP Additional Notes: Patient only wants to see PCP- offered appointment first available- but wants to be seen late today or tomorrow.    Copied from CRM [id number]. Topic: Clinical - Red Word Triage >> Jun 23, 2023  8:21 AM Carlatta H wrote: [facility] that prompted transfer to Nurse Triage: Cyst on back for months//Swelling and discharge// Reason for Disposition  [1] Boil > 1/2 inch across (> 12 mm; larger than a marble) AND [2] center is soft or pus colored  Answer Assessment - Initial Assessment Questions 1. APPEARANCE of BOIL: "What does the boil look like?"      Can not tell 2. LOCATION: "Where is the boil located?"      Below the shoulder on back 3. NUMBER: "How many boils are there?"      one 4. SIZE: "How big is the boil?" (e.g., inches, cm; compare to size of a coin or other object)     Quarter size 5. ONSET: "When did the boil start?"     1 month 6. PAIN: "Is there any pain?" If Yes, ask: "How bad is the pain?"   (Scale 1-10; or mild, moderate, severe)     Feels more with pressure- mild/moderate 7. FEVER: "Do you have a fever?" If Yes, ask: "What is it, how was it measured, and when did it start?"      no  Protocols used: Boil (Skin Abscess)-A-AH

## 2023-06-24 ENCOUNTER — Encounter: Payer: Self-pay | Admitting: Family Medicine

## 2023-06-24 ENCOUNTER — Ambulatory Visit (INDEPENDENT_AMBULATORY_CARE_PROVIDER_SITE_OTHER): Admitting: Family Medicine

## 2023-06-24 VITALS — BP 116/80 | HR 76 | Wt 168.4 lb

## 2023-06-24 DIAGNOSIS — Z794 Long term (current) use of insulin: Secondary | ICD-10-CM | POA: Diagnosis not present

## 2023-06-24 DIAGNOSIS — E118 Type 2 diabetes mellitus with unspecified complications: Secondary | ICD-10-CM

## 2023-06-24 DIAGNOSIS — L723 Sebaceous cyst: Secondary | ICD-10-CM | POA: Diagnosis not present

## 2023-06-24 DIAGNOSIS — L089 Local infection of the skin and subcutaneous tissue, unspecified: Secondary | ICD-10-CM

## 2023-06-24 MED ORDER — LIDOCAINE HCL 1 % IJ SOLN
10.0000 mL | Freq: Once | INTRAMUSCULAR | Status: AC
  Administered 2023-06-24: 10 mL via INTRADERMAL

## 2023-06-24 NOTE — Progress Notes (Signed)
   Subjective:    Patient ID: [name], male    DOB: [birth year], 73 y.o.   MRN: [id number]  HPI He is here for evaluation and treatment of an infected lesion on his mid upper back area.  We also has diabetes and would like to review the information.   Review of Systems     Objective:    Physical Exam A 3 cm round indurated slightly uncomfortable lesion is noted over the mid upper back area.       Assessment & Plan:  Infected sebaceous cyst  Controlled type 2 diabetes mellitus with complication, with long-term current use of insulin  (HCC) The cyst was injected with Xylocaine and epinephrine.  A 2 cm incision was made.  Purulent material was expressed from the wound.  The wound was cleaned with gauze.  No residual cyst sac was noted.  It was packed with iodoform.  He is to leave the dressing in place for a couple of days and then after that take the dressing off and irrigate it a couple times per day.  I then discussed the CGM report.  He did have a few occasions where his blood sugar would go up with his lunch meals.  We discussed ways to cut back on it in terms of cutting back on food rather than stopping it.  He also had 1 episode where he did not eat anything the whole day and did have a couple of episodes that his alarm to go off.  Reinforced the need for him to not skip meals.

## 2023-06-24 NOTE — Addendum Note (Signed)
 Addended by: Eddye Goodie on: 06/24/2023 10:45 AM   Modules accepted: Orders

## 2023-06-25 ENCOUNTER — Telehealth: Payer: Self-pay | Admitting: Family Medicine

## 2023-06-25 NOTE — Telephone Encounter (Signed)
 PAP SOLIQUA  was faxed 03/26/23 by Shelvy Dickens We received PAP Soliqua  in office Wife informed

## 2023-07-16 ENCOUNTER — Other Ambulatory Visit: Payer: Self-pay | Admitting: Family Medicine

## 2023-07-16 DIAGNOSIS — E1169 Type 2 diabetes mellitus with other specified complication: Secondary | ICD-10-CM

## 2023-07-21 ENCOUNTER — Other Ambulatory Visit: Payer: Self-pay | Admitting: Family Medicine

## 2023-07-21 NOTE — Telephone Encounter (Signed)
 Refill approved.

## 2023-07-23 ENCOUNTER — Ambulatory Visit: Payer: Medicare Other | Admitting: Family Medicine

## 2023-07-30 ENCOUNTER — Encounter: Payer: Self-pay | Admitting: Family Medicine

## 2023-07-30 ENCOUNTER — Ambulatory Visit: Payer: Medicare Other | Admitting: Family Medicine

## 2023-07-30 DIAGNOSIS — Z0000 Encounter for general adult medical examination without abnormal findings: Secondary | ICD-10-CM | POA: Diagnosis not present

## 2023-07-30 DIAGNOSIS — N1831 Chronic kidney disease, stage 3a: Secondary | ICD-10-CM

## 2023-07-30 DIAGNOSIS — E1169 Type 2 diabetes mellitus with other specified complication: Secondary | ICD-10-CM | POA: Diagnosis not present

## 2023-07-30 DIAGNOSIS — D126 Benign neoplasm of colon, unspecified: Secondary | ICD-10-CM

## 2023-07-30 DIAGNOSIS — N182 Chronic kidney disease, stage 2 (mild): Secondary | ICD-10-CM | POA: Diagnosis not present

## 2023-07-30 DIAGNOSIS — N529 Male erectile dysfunction, unspecified: Secondary | ICD-10-CM

## 2023-07-30 DIAGNOSIS — E1159 Type 2 diabetes mellitus with other circulatory complications: Secondary | ICD-10-CM

## 2023-07-30 DIAGNOSIS — Z23 Encounter for immunization: Secondary | ICD-10-CM

## 2023-07-30 DIAGNOSIS — E785 Hyperlipidemia, unspecified: Secondary | ICD-10-CM | POA: Diagnosis not present

## 2023-07-30 DIAGNOSIS — E1122 Type 2 diabetes mellitus with diabetic chronic kidney disease: Secondary | ICD-10-CM

## 2023-07-30 DIAGNOSIS — I152 Hypertension secondary to endocrine disorders: Secondary | ICD-10-CM

## 2023-07-30 LAB — POCT GLYCOSYLATED HEMOGLOBIN (HGB A1C): Hemoglobin A1C: 7 % — AB (ref 4.0–5.6)

## 2023-07-30 MED ORDER — ENALAPRIL-HYDROCHLOROTHIAZIDE 5-12.5 MG PO TABS: 1.0000 | ORAL_TABLET | Freq: Every day | ORAL | 3 refills | Status: AC

## 2023-07-30 MED ORDER — METFORMIN HCL 1000 MG PO TABS: 1000.0000 mg | ORAL_TABLET | Freq: Two times a day (BID) | ORAL | 1 refills | Status: DC

## 2023-07-30 MED ORDER — DAPAGLIFLOZIN PROPANEDIOL 10 MG PO TABS: 10.0000 mg | ORAL_TABLET | Freq: Every day | ORAL | 1 refills | Status: DC

## 2023-07-30 NOTE — Progress Notes (Deleted)
 Subjective:    Patient ID: [name], male    DOB: [birth year], 73 y.o.   MRN: [id number]  [name] is a 73 y.o. male who presents for follow-up of Type 2 diabetes mellitus.  Home blood sugar records: 69-85 Current symptoms/problems include none and have been improving Daily foot checks: yes   Any foot concerns: no How often blood sugars checked: freestyle libre 3  Exercise: yard work, walking  Diet: no restrictions  Complete physical exam  Patient: [name]   DOB: 1950-03-16   73 y.o. Male  MRN: [id number]  Subjective:     Chief Complaint  Patient presents with   Annual Exam    Cpe/awv. Fasting. Diabetes follow up.    [name] is a 73 y.o. male who presents today for a complete physical exam.  He reports consuming a general diet. Yard work, walking He generally feels well. He reports sleeping poorly.  Most recent fall risk assessment:    07/30/2023   10:04 AM  Fall Risk   Falls in the past year? 0  Number falls in past yr: 0  Injury with Fall? 0  Risk for fall due to : No Fall Risks  Follow up Falls evaluation completed     Most recent depression screenings:    07/30/2023   10:04 AM 11/19/2021    1:59 PM  PHQ 2/9 Scores  PHQ - 2 Score 0 0    Vision:Not within last year  and no regular dental care.   {History (Optional):23778} [name] is a 73 y.o. male who presents for annual wellness visit and follow-up on chronic medical conditions.  He has the following concerns:   Immunizations and Health Maintenance Immunization History  Administered Date(s) Administered   Fluad Quad(high Dose 65+) 10/27/2018, 11/28/2020, 11/19/2021   Influenza Split 02/20/2011, 12/25/2012   Influenza, High Dose Seasonal PF 12/21/2017   Influenza,inj,Quad PF,6+ Mos 11/10/2013, 10/26/2015, 11/06/2016   Influenza-Unspecified 11/24/2019   PFIZER(Purple Top)SARS-COV-2 Vaccination 03/31/2019, 04/21/2019, 12/14/2019   Pfizer Covid-19 Vaccine  Bivalent Booster 15yrs & up 11/28/2020   Pfizer(Comirnaty)Fall Seasonal Vaccine 12 years and older 03/20/2022   Pneumococcal Conjugate-13 11/10/2013   Pneumococcal Polysaccharide-23 10/15/2010   Tdap 02/25/2007, 07/25/2016   Zoster Recombinant(Shingrix ) 07/25/2016, 11/06/2016   Zoster, Live 02/09/2012   Health Maintenance Due  Topic Date Due   Pneumonia Vaccine 16+ Years old (3 of 3 - PPSV23, PCV20 or PCV21) 10/15/2015   Diabetic kidney evaluation - Urine ACR  08/22/2022   FOOT EXAM  08/22/2022   COVID-19 Vaccine (6 - 2024-25 season) 10/26/2022   Medicare Annual Wellness (AWV)  11/20/2022    Last colonoscopy: Last PSA: Dentist: Ophtho: Exercise:  Other doctors caring for patient include:  Advanced Directives: Does Patient Have a Medical Advance Directive?: No Would patient like information on creating a medical advance directive?: No - Patient declined  Depression screen:  See questionnaire below.        07/30/2023   10:04 AM 11/19/2021    1:59 PM 08/21/2021    3:02 PM 04/17/2020   11:19 AM 08/30/2019    8:53 AM  Depression screen PHQ 2/9  Decreased Interest 0 0 0 0 0  Down, Depressed, Hopeless 0 0 0 0 0  PHQ - 2 Score 0 0 0 0 0    Fall Screen: See Questionaire below.      07/30/2023   10:04 AM 03/20/2022    3:52 PM 11/19/2021    2:01 PM 08/21/2021  3:02 PM 04/17/2020   11:18 AM  Fall Risk   Falls in the past year? 0 0 0 0 0  Number falls in past yr: 0 0 0 0 0  Injury with Fall? 0 0 0 0 0  Risk for fall due to : No Fall Risks No Fall Risks No Fall Risks No Fall Risks No Fall Risks  Follow up Falls evaluation completed Falls evaluation completed Falls evaluation completed Falls evaluation completed Falls evaluation completed    ADL screen:  See questionnaire below.  Functional Status Survey: Is the patient deaf or have difficulty hearing?: No Does the patient have difficulty seeing, even when wearing glasses/contacts?: No Does the patient have difficulty  concentrating, remembering, or making decisions?: No Does the patient have difficulty walking or climbing stairs?: No Does the patient have difficulty dressing or bathing?: No Does the patient have difficulty doing errands alone such as visiting a doctor's office or shopping?: No   Review of Systems  Constitutional: -, -unexpected weight change, -anorexia, -fatigue Allergy: -sneezing, -itching, -congestion Dermatology: denies changing moles, rash, lumps ENT: -runny nose, -ear pain, -sore throat,  Cardiology:  -chest pain, -palpitations, -orthopnea, Respiratory: -cough, -shortness of breath, -dyspnea on exertion, -wheezing,  Gastroenterology: -abdominal pain, -nausea, -vomiting, -diarrhea, -constipation, -dysphagia Hematology: -bleeding or bruising problems Musculoskeletal: -arthralgias, -myalgias, -joint swelling, -back pain, - Ophthalmology: -vision changes,  Urology: -dysuria, -difficulty urinating,  -urinary frequency, -urgency, incontinence Neurology: -, -numbness, , -memory loss, -falls, -dizziness    PHYSICAL EXAM:  There were no vitals taken for this visit.  General Appearance: Alert, cooperative, no distress, appears stated age Head: Normocephalic, without obvious abnormality, atraumatic Eyes: PERRL, conjunctiva/corneas clear, EOM's intact, fundi benign Ears: Normal TM's and external ear canals Nose: Nares normal, mucosa normal, no drainage or sinus   tenderness Throat: Lips, mucosa, and tongue normal; teeth and gums normal Neck: Supple, no lymphadenopathy, thyroid:no enlargement/tenderness/nodules; no carotid bruit or JVD Lungs: Clear to auscultation bilaterally without wheezes, rales or ronchi; respirations unlabored Heart: Regular rate and rhythm, S1 and S2 normal, no murmur, rub or gallop Abdomen: Soft, non-tender, nondistended, normoactive bowel sounds, no masses, no hepatosplenomegaly Extremities: No clubbing, cyanosis or edema Pulses: 2+ and symmetric all  extremities Skin: Skin color, texture, turgor normal, no rashes or lesions Lymph nodes: Cervical, supraclavicular, and axillary nodes normal Neurologic: CNII-XII intact, normal strength, sensation and gait; reflexes 2+ and symmetric throughout   Psych: Normal mood, affect, hygiene and grooming  ASSESSMENT/PLAN: Stage 3a chronic kidney disease (HCC)  Hypertension associated with diabetes (HCC)  Hyperlipidemia associated with type 2 diabetes mellitus (HCC)  ED (erectile dysfunction) of organic origin  Controlled type 2 diabetes mellitus with stage 2 chronic kidney disease, unspecified whether long term insulin  use (HCC)  Adenomatous polyp of colon, unspecified part of colon    Discussed PSA screening (risks/benefits), recommended at least 30 minutes of aerobic activity at least 5 days/week; proper sunscreen use reviewed; healthy diet and alcohol recommendations (less than or equal to 2 drinks/day) reviewed; regular seatbelt use; changing batteries in smoke detectors. Immunization recommendations discussed.  Colonoscopy recommendations reviewed.   Medicare Attestation I have personally reviewed: The patient's medical and social history Their use of alcohol, tobacco or illicit drugs Their current medications and supplements The patient's functional ability including ADLs,fall risks, home safety risks, cognitive, and hearing and visual impairment Diet and physical activities Evidence for depression or mood disorders  The patient's weight, height, and BMI have been recorded in the chart.  I have made referrals,  counseling, and provided education to the patient based on review of the above and I have provided the patient with a written personalized care plan for preventive services.     James Cobbs, [name]   07/30/2023     Immunization History  Administered Date(s) Administered   Fluad Quad(high Dose 65+) 10/27/2018, 11/28/2020, 11/19/2021   Influenza Split 02/20/2011, 12/25/2012    Influenza, High Dose Seasonal PF 12/21/2017   Influenza,inj,Quad PF,6+ Mos 11/10/2013, 10/26/2015, 11/06/2016   Influenza-Unspecified 11/24/2019   PFIZER(Purple Top)SARS-COV-2 Vaccination 03/31/2019, 04/21/2019, 12/14/2019   Pfizer Covid-19 Vaccine Bivalent Booster 61yrs & up 11/28/2020   Pfizer(Comirnaty)Fall Seasonal Vaccine 12 years and older 03/20/2022   Pneumococcal Conjugate-13 11/10/2013   Pneumococcal Polysaccharide-23 10/15/2010   Tdap 02/25/2007, 07/25/2016   Zoster Recombinant(Shingrix ) 07/25/2016, 11/06/2016   Zoster, Live 02/09/2012    Health Maintenance  Topic Date Due   Pneumonia Vaccine 65+ Years old (3 of 3 - PPSV23, PCV20 or PCV21) 10/15/2015   Diabetic kidney evaluation - Urine ACR  08/22/2022   FOOT EXAM  08/22/2022   COVID-19 Vaccine (6 - 2024-25 season) 10/26/2022   Medicare Annual Wellness (AWV)  11/20/2022   HEMOGLOBIN A1C  08/06/2023   OPHTHALMOLOGY EXAM  08/13/2023   INFLUENZA VACCINE  09/25/2023   Diabetic kidney evaluation - eGFR measurement  02/08/2024   Colonoscopy  07/15/2025   DTaP/Tdap/Td (3 - Td or Tdap) 07/26/2026   Hepatitis C Screening  Completed   Zoster Vaccines- Shingrix   Completed   HPV VACCINES  Aged Out   Meningococcal B Vaccine  Aged Out    Patient Care Team: James Hacking, [name] as PCP - General (Family Medicine)   Outpatient Medications Prior to Visit  Medication Sig   atorvastatin  (LIPITOR) 40 MG tablet TAKE 1 TABLET BY MOUTH DAILY   Continuous Glucose Receiver (FREESTYLE LIBRE 3 READER) DEVI Check continuous glucose   Continuous Glucose Sensor (FREESTYLE LIBRE 3 SENSOR) MISC PLACE 1 SENSOR ON THE SKIN EVERY 14 DAYS . USE TO CHECK BLOOD  GLUCOSE CONTINUOUSLY   Enalapril -hydroCHLOROthiazide  5-12.5 MG tablet Take 1 tablet by mouth daily.   Insulin  Glargine-Lixisenatide (SOLIQUA ) 100-33 UNT-MCG/ML SOPN INJECT 15 UNITS INTO THE SKIN DAILY.   Insulin  Pen Needle 32G X 4 MM MISC 1 Needle by Does not apply route daily.   Multiple  Vitamins-Minerals (MULTIVITAMIN WITH MINERALS) tablet Take 1 tablet by mouth daily.   tadalafil  (CIALIS ) 20 MG tablet Take 1 tablet (20 mg total) by mouth daily as needed for erectile dysfunction.   aspirin  81 MG tablet Take 81 mg by mouth daily. (Patient not taking: Reported on 02/16/2023)   Facility-Administered Medications Prior to Visit  Medication Dose Route Frequency Provider   0.9 %  sodium chloride  infusion  500 mL Intravenous Once Armbruster, Lendon Queen, [name]    ROS  Family and social history as well as health maintenance and immunizations was reviewed.     Objective:    There were no vitals taken for this visit. {Vitals History (Optional):23777}  Physical Exam   No results found for any visits on 07/30/23. {Show previous labs (optional):23779}    Assessment & Plan:    Discussed health benefits of physical activity, and encouraged him to engage in regular exercise appropriate for his age and condition.  Stage 3a chronic kidney disease (HCC)  Hypertension associated with diabetes (HCC)  Hyperlipidemia associated with type 2 diabetes mellitus (HCC)  ED (erectile dysfunction) of organic origin  Controlled type 2 diabetes mellitus with stage 2 chronic kidney disease,  unspecified whether long term insulin  use (HCC)  Adenomatous polyp of colon, unspecified part of colon   No follow-ups on file.      Eddye Goodie, CMA   The following portions of the patient's history were reviewed and updated as appropriate: allergies, current medications, past medical history, past social history and problem list.  ROS as in subjective above.     Objective:     Physical Exam Alert and in no distress otherwise not examined.  There were no vitals taken for this visit.  Lab Review    Latest Ref Rng & Units 02/08/2023    5:35 AM 02/07/2023    3:27 AM 02/06/2023    8:34 PM 02/06/2023    5:08 PM 02/06/2023    2:58 PM  Diabetic Labs  Creatinine 0.61 - 1.24 mg/dL 6.96  2.95   2.84  1.32  1.46       06/24/2023    9:21 AM 03/26/2023   10:16 AM 02/16/2023   12:20 PM 02/10/2023    1:35 PM 02/08/2023    5:50 AM  BP/Weight  Systolic BP 116 112 116 110 128  Diastolic BP 80 70 70 70 78  Wt. (Lbs) 168.4 158.6 157.4 159.6   BMI 23.49 kg/m2 22.12 kg/m2 21.95 kg/m2 22.26 kg/m2       Latest Ref Rng & Units 08/13/2022   12:00 AM 10/01/2021   12:00 AM  Foot/eye exam completion dates  Eye Exam No Retinopathy No Retinopathy     No Retinopathy         This result is from an external source.    [name]  reports that he quit smoking about 43 years ago. His smoking use included cigarettes. He has never used smokeless tobacco. He reports current drug use. Drug: Marijuana. He reports that he does not drink alcohol.     Assessment & Plan:    Stage 3a chronic kidney disease (HCC)  Hypertension associated with diabetes (HCC)  Hyperlipidemia associated with type 2 diabetes mellitus (HCC)  ED (erectile dysfunction) of organic origin  Controlled type 2 diabetes mellitus with stage 2 chronic kidney disease, unspecified whether long term insulin  use (HCC)  Adenomatous polyp of colon, unspecified part of colon  Rx changes: {none:33079} Education: Reviewed 'ABCs' of diabetes management (respective goals in parentheses):  A1C (<7), blood pressure (<130/80), and cholesterol (LDL <100). Compliance at present is estimated to be {good/fair/poor:33178}. Efforts to improve compliance (if necessary) will be directed at {compliance:16716}. Follow up: {NUMBERS; 0-10:33138} {time:11}

## 2023-07-30 NOTE — Progress Notes (Signed)
 [name] is a 73 y.o. male who presents for annual wellness visit,CPE and follow-up on chronic medical conditions.  He is taking metformin  1000 twice daily as well as Farxiga  10 mg.  He also is taking Soliqua  at 17.  Review of the record with him on the AGP report shows that he has had several episodes of hypoglycemia.  He does plan to get his eyes checked. He continues to use Cialis  as needed.  Also taking Lipitor as well as enalapril /HCTZ.  He has no other concerns or complaints. Immunizations and Health Maintenance Immunization History  Administered Date(s) Administered   Fluad Quad(high Dose 65+) 10/27/2018, 11/28/2020, 11/19/2021   Influenza Split 02/20/2011, 12/25/2012   Influenza, High Dose Seasonal PF 12/21/2017   Influenza,inj,Quad PF,6+ Mos 11/10/2013, 10/26/2015, 11/06/2016   Influenza-Unspecified 11/24/2019   PFIZER(Purple Top)SARS-COV-2 Vaccination 03/31/2019, 04/21/2019, 12/14/2019   PNEUMOCOCCAL CONJUGATE-20 07/30/2023   Pfizer Covid-19 Vaccine Bivalent Booster 60yrs & up 11/28/2020   Pfizer(Comirnaty)Fall Seasonal Vaccine 12 years and older 03/20/2022   Pneumococcal Conjugate-13 11/10/2013   Pneumococcal Polysaccharide-23 10/15/2010   Tdap 02/25/2007, 07/25/2016   Zoster Recombinant(Shingrix ) 07/25/2016, 11/06/2016   Zoster, Live 02/09/2012   Health Maintenance Due  Topic Date Due   Diabetic kidney evaluation - Urine ACR  08/22/2022   FOOT EXAM  08/22/2022   COVID-19 Vaccine (6 - 2024-25 season) 10/26/2022   Medicare Annual Wellness (AWV)  11/20/2022    Last colonoscopy: 07/16/2018 Last PSA:N/A Dentist: no regular dental care Ophtho: not within the last year  Exercise: yard work.   Other doctors caring for patient include:----  Advanced Directives: Does Patient Have a Medical Advance Directive?: No Would patient like information on creating a medical advance directive?: No - Patient declined  Depression screen:  See questionnaire below.         07/30/2023   10:04 AM 11/19/2021    1:59 PM 08/21/2021    3:02 PM 04/17/2020   11:19 AM 08/30/2019    8:53 AM  Depression screen PHQ 2/9  Decreased Interest 0 0 0 0 0  Down, Depressed, Hopeless 0 0 0 0 0  PHQ - 2 Score 0 0 0 0 0    Fall Screen: See Questionaire below.      07/30/2023   10:04 AM 03/20/2022    3:52 PM 11/19/2021    2:01 PM 08/21/2021    3:02 PM 04/17/2020   11:18 AM  Fall Risk   Falls in the past year? 0 0 0 0 0  Number falls in past yr: 0 0 0 0 0  Injury with Fall? 0 0 0 0 0  Risk for fall due to : No Fall Risks No Fall Risks No Fall Risks No Fall Risks No Fall Risks  Follow up Falls evaluation completed Falls evaluation completed Falls evaluation completed Falls evaluation completed Falls evaluation completed    ADL screen:  See questionnaire below.  Functional Status Survey: Is the patient deaf or have difficulty hearing?: No Does the patient have difficulty seeing, even when wearing glasses/contacts?: No Does the patient have difficulty concentrating, remembering, or making decisions?: No Does the patient have difficulty walking or climbing stairs?: No Does the patient have difficulty dressing or bathing?: No Does the patient have difficulty doing errands alone such as visiting a doctor's office or shopping?: No   Review of Systems  Constitutional: -, -unexpected weight change, -anorexia, -fatigue Allergy: -sneezing, -itching, -congestion Dermatology: denies changing moles, rash, lumps ENT: -runny nose, -ear pain, -sore throat,  Cardiology:  -  chest pain, -palpitations, -orthopnea, Respiratory: -cough, -shortness of breath, -dyspnea on exertion, -wheezing,  Gastroenterology: -abdominal pain, -nausea, -vomiting, -diarrhea, -constipation, -dysphagia Hematology: -bleeding or bruising problems Musculoskeletal: -arthralgias, -myalgias, -joint swelling, -back pain, - Ophthalmology: -vision changes,  Urology: -dysuria, -difficulty urinating,  -urinary frequency,  -urgency, incontinence Neurology: -, -numbness, , -memory loss, -falls, -dizziness    PHYSICAL EXAM:  BP 120/64   Pulse 91   Ht 5\' 11"  (1.803 m)   Wt 166 lb 6.4 oz (75.5 kg)   SpO2 94%   BMI 23.21 kg/m   General Appearance: Alert, cooperative, no distress, appears stated age Head: Normocephalic, without obvious abnormality, atraumatic Eyes: PERRL, conjunctiva/corneas clear, EOM's intact, fundi benign Ears: Normal TM's and external ear canals Nose: Nares normal, mucosa normal, no drainage or sinus   tenderness Throat: Lips, mucosa, and tongue normal; teeth and gums normal Neck: Supple, no lymphadenopathy, thyroid:no enlargement/tenderness/nodules; no carotid bruit or JVD Lungs: Clear to auscultation bilaterally without wheezes, rales or ronchi; respirations unlabored Heart: Regular rate and rhythm, S1 and S2 normal, no murmur, rub or gallop Abdomen: Soft, non-tender, nondistended, normoactive bowel sounds, no masses, no hepatosplenomegaly Extremities: No clubbing, cyanosis or edema Pulses: 2+ and symmetric all extremities Skin: Skin color, texture, turgor normal, no rashes or lesions Lymph nodes: Cervical, supraclavicular, and axillary nodes normal Neurologic: CNII-XII intact, normal strength, sensation and gait; reflexes 2+ and symmetric throughout   Psych: Normal mood, affect, hygiene and grooming Hemoglobin A1c is 7.0 The AGP report was reviewed and discussed with him. ASSESSMENT/PLAN: Stage 3a chronic kidney disease (HCC)  Hypertension associated with diabetes (HCC) - Plan: Enalapril -hydroCHLOROthiazide  5-12.5 MG tablet  Hyperlipidemia associated with type 2 diabetes mellitus (HCC)  ED (erectile dysfunction) of organic origin  Controlled type 2 diabetes mellitus with stage 2 chronic kidney disease, unspecified whether long term insulin  use (HCC) - Plan: POCT glycosylated hemoglobin (Hb A1C), metFORMIN  (GLUCOPHAGE ) 1000 MG tablet, dapagliflozin  propanediol (FARXIGA ) 10 MG  TABS tablet  Adenomatous polyp of colon, unspecified part of colon  Need for vaccination against Streptococcus pneumoniae - Plan: Pneumococcal conjugate vaccine 20-valent (Prevnar 20)  Routine general medical examination at a health care facility  I congratulated him on the work that he is doing.  Plan to see him back here in 4 months for follow-up.  Discussed Immunization recommendations discussed.  Colonoscopy recommendations reviewed.   Medicare Attestation I have personally reviewed: The patient's medical and social history Their use of alcohol, tobacco or illicit drugs Their current medications and supplements The patient's functional ability including ADLs,fall risks, home safety risks, cognitive, and hearing and visual impairment Diet and physical activities Evidence for depression or mood disorders  The patient's weight, height, and BMI have been recorded in the chart.  I have made referrals, counseling, and provided education to the patient based on review of the above and I have provided the patient with a written personalized care plan for preventive services.     Ron Cobbs, MD   07/30/2023

## 2023-09-01 ENCOUNTER — Other Ambulatory Visit: Payer: Self-pay | Admitting: Medical

## 2023-09-01 NOTE — Telephone Encounter (Signed)
  This was sent to mail order originally. I will resent to local pharmacy

## 2023-09-10 ENCOUNTER — Other Ambulatory Visit

## 2023-09-11 NOTE — Progress Notes (Unsigned)
 Patient came in 09/10/2023 for a nurse visit and states his herlene sensor was giving him low readings.   Patient has not been checking his sugars manually when he sees these low readings. Advised patient he needs to check manual to see if its accurate. Patient was out in the sun that day fishing and had not eaten much. Advised to make sure he is eating protein to keep his energy up while in the heat and staying well hydrated. Patient states he felt great that day.  Ludie Gent, Pa-C was aware of patient responses.

## 2023-09-23 ENCOUNTER — Telehealth: Payer: Self-pay

## 2023-09-23 NOTE — Telephone Encounter (Signed)
 Received a request for a new rx from [facility] on Fraxiga can be fax to [facility] [phone].

## 2023-09-29 ENCOUNTER — Telehealth: Payer: Self-pay

## 2023-09-29 DIAGNOSIS — N182 Chronic kidney disease, stage 2 (mild): Secondary | ICD-10-CM

## 2023-09-29 NOTE — Telephone Encounter (Signed)
 Received a refill request from [facility] for Farxiga  can be fax to [facility] to [phone]

## 2023-09-30 MED ORDER — DAPAGLIFLOZIN PROPANEDIOL 10 MG PO TABS: 10.0000 mg | ORAL_TABLET | Freq: Every day | ORAL | 1 refills | Status: AC

## 2023-09-30 NOTE — Addendum Note (Signed)
 Addended by: LIONELL JON DEL on: 09/30/2023 08:08 AM   Modules accepted: Orders

## 2023-10-22 ENCOUNTER — Telehealth: Payer: Self-pay

## 2023-10-22 NOTE — Telephone Encounter (Signed)
 PAP Soliqua  received, Jon informed pt

## 2023-10-22 NOTE — Progress Notes (Signed)
   10/22/2023  Patient ID: Blaise MALVA Cain, male   DOB: 1950-10-14, 73 y.o.   MRN: [id number]  Attempted to contact patient to notify Soliqua  PAP available for pickup. Left HIPAA compliant message.  Jon VEAR Lindau, PharmD Clinical Pharmacist [phone]

## 2023-12-01 ENCOUNTER — Encounter: Payer: Self-pay | Admitting: Family Medicine

## 2023-12-01 ENCOUNTER — Ambulatory Visit (INDEPENDENT_AMBULATORY_CARE_PROVIDER_SITE_OTHER): Admitting: Family Medicine

## 2023-12-01 VITALS — BP 138/68 | HR 73 | Ht 71.5 in | Wt 174.6 lb

## 2023-12-01 DIAGNOSIS — E1159 Type 2 diabetes mellitus with other circulatory complications: Secondary | ICD-10-CM

## 2023-12-01 DIAGNOSIS — Z23 Encounter for immunization: Secondary | ICD-10-CM

## 2023-12-01 DIAGNOSIS — E785 Hyperlipidemia, unspecified: Secondary | ICD-10-CM

## 2023-12-01 DIAGNOSIS — N5201 Erectile dysfunction due to arterial insufficiency: Secondary | ICD-10-CM

## 2023-12-01 DIAGNOSIS — I152 Hypertension secondary to endocrine disorders: Secondary | ICD-10-CM | POA: Diagnosis not present

## 2023-12-01 DIAGNOSIS — E1169 Type 2 diabetes mellitus with other specified complication: Secondary | ICD-10-CM | POA: Diagnosis not present

## 2023-12-01 DIAGNOSIS — E119 Type 2 diabetes mellitus without complications: Secondary | ICD-10-CM | POA: Diagnosis not present

## 2023-12-01 LAB — POCT GLYCOSYLATED HEMOGLOBIN (HGB A1C): Hemoglobin A1C: 6.8 % — AB (ref 4.0–5.6)

## 2023-12-01 MED ORDER — TADALAFIL 20 MG PO TABS
20.0000 mg | ORAL_TABLET | Freq: Every day | ORAL | 5 refills | Status: AC | PRN
Start: 2023-12-01 — End: ?

## 2023-12-01 NOTE — Progress Notes (Signed)
 Subjective:    Patient ID: [name], male    DOB: 1950/09/07, 73 y.o.   MRN: [id number]  [name] is a 73 y.o. male who presents for follow-up of Type 2 diabetes mellitus.  Home blood sugar records: 100-125 Current symptoms/problems include none and have been unchanged. Daily foot checks:yes   Any foot concerns: no How often blood sugars checked: continuous Exercise: walk and work around home Diet:General diet The following portions of the patient's history were reviewed and updated as appropriate: allergies, current medications, past medical history, past social history and problem list. Discussed the use of AI scribe software for clinical note transcription with the patient, who gave verbal consent to proceed. He manages his diabetes with Farxiga , metformin , and Soliqua , taking 15 units of Soliqua  daily. Blood sugar levels are generally stable, though he experienced a low reading of 69 mg/dL after skipping a meal. His A1c level is currently 6.8, improved from 7.0 in June. He recently consumed a small piece of cake at a birthday celebration, which may have temporarily increased his blood sugar levels.  He is on enalapril  and a cholesterol medication. He discontinued aspirin  over a year ago. He takes a multivitamin regularly.  He has not received a COVID-19 vaccine recently.  He mentions plans to go fishing and watch a game on television, indicating an active lifestyle.      ROS as in subjective above.     Objective:    Physical Exam Alert and in no distress otherwise not examined. The AGP report was reviewed with him.  He now recognizes the need to eat regular meals and to adjust if his blood sugar does go above 180. Hemoglobin A1c is 6.8     Latest Ref Rng & Units 07/30/2023   12:00 PM 02/08/2023    5:35 AM 02/07/2023    3:27 AM 02/06/2023    8:34 PM 02/06/2023    5:08 PM  Diabetic Labs  HbA1c 4.0 - 5.6 % 7.0       Creatinine 0.61 - 1.24 mg/dL  8.76  8.75   8.60  8.61       12/01/2023    8:39 AM 07/30/2023   10:15 AM 06/24/2023    9:21 AM 03/26/2023   10:16 AM 02/16/2023   12:20 PM  BP/Weight  Systolic BP 138 120 116 112 116  Diastolic BP 68 64 80 70 70  Wt. (Lbs) 174.6 166.4 168.4 158.6 157.4  BMI 24.01 kg/m2 23.21 kg/m2 23.49 kg/m2 22.12 kg/m2 21.95 kg/m2      Latest Ref Rng & Units 08/13/2022   12:00 AM 10/01/2021   12:00 AM  Foot/eye exam completion dates  Eye Exam No Retinopathy No Retinopathy     No Retinopathy         This result is from an external source.    [name]  reports that he quit smoking about 43 years ago. His smoking use included cigarettes. He has never used smokeless tobacco. He reports current drug use. Drug: Marijuana. He reports that he does not drink alcohol.     Assessment & Plan:    Diabetes mellitus type 2, controlled (HCC) - Plan: POCT glycosylated hemoglobin (Hb A1C)  Hyperlipidemia associated with type 2 diabetes mellitus (HCC)  Hypertension associated with diabetes (HCC)  Need for COVID-19 vaccine - Plan: Pfizer Comirnaty Covid -19 Vaccine 10yrs and older  Need for influenza vaccination - Plan: Flu vaccine HIGH DOSE PF(Fluzone Trivalent)  Erectile dysfunction due to arterial insufficiency -  Plan: tadalafil  (CIALIS ) 20 MG tablet I congratulated him on the work that he is doing with the AGP and explained that eventually he will be able to do this on his own.

## 2023-12-02 ENCOUNTER — Telehealth: Payer: Self-pay

## 2023-12-02 NOTE — Telephone Encounter (Signed)
 Gave pt a call,pt is coming up due for reenrollment on AZ&ME Farxiga  left a HIPAA VM will mail out pt portion and fax provider portion.

## 2023-12-16 NOTE — Progress Notes (Signed)
 [name]                                          MRN: [id number]   12/16/2023   The [facility] Quality Team Specialist reviewed this patient medical record for the purposes of chart review for care gap closure. The following were reviewed: abstraction for care gap closure-glycemic status assessment.    [facility] Quality Team

## 2023-12-17 ENCOUNTER — Other Ambulatory Visit: Payer: Self-pay | Admitting: Family Medicine

## 2024-01-03 ENCOUNTER — Other Ambulatory Visit: Payer: Self-pay | Admitting: Family Medicine

## 2024-01-04 ENCOUNTER — Other Ambulatory Visit (HOSPITAL_COMMUNITY): Payer: Self-pay

## 2024-01-04 NOTE — Telephone Encounter (Signed)
 Received pt portion AZ&ME Farxiga today waiting on provider portion.

## 2024-01-27 ENCOUNTER — Telehealth: Payer: Self-pay

## 2024-01-27 NOTE — Telephone Encounter (Signed)
 Copied from CRM [id number]. Topic: General - Other >> Jan 27, 2024  2:22 PM Joesph B wrote: Reason for CRM: Gailen patients wife is calling to speak to Leita. Advised her laura will call when she is back in the office.

## 2024-01-29 ENCOUNTER — Telehealth: Payer: Self-pay

## 2024-01-29 NOTE — Telephone Encounter (Signed)
 Copied from CRM [id number]. Topic: General - Other >> Jan 29, 2024 10:21 AM Everette C wrote: Reason for CRM: The patient's wife has called to inquire about the approval of their AZ&Me,and would like to discuss further with Leita when possible

## 2024-02-02 LAB — LAB REPORT - SCANNED

## 2024-02-03 NOTE — Telephone Encounter (Signed)
 Left message for Vanetta

## 2024-02-05 ENCOUNTER — Other Ambulatory Visit: Payer: Self-pay | Admitting: Family Medicine

## 2024-02-05 DIAGNOSIS — E1122 Type 2 diabetes mellitus with diabetic chronic kidney disease: Secondary | ICD-10-CM

## 2024-02-08 ENCOUNTER — Other Ambulatory Visit: Payer: Self-pay | Admitting: Family Medicine

## 2024-02-08 ENCOUNTER — Telehealth: Payer: Self-pay | Admitting: Family Medicine

## 2024-02-08 NOTE — Telephone Encounter (Signed)
 Pt's wife Gailen called & was upset because she hasn't heard anything back on her Az & Me application that was sent in over a month ago,  I see the patient's portion in media, but not the provider section.  Also I asked about the Soliqua  & she says she hasn't heard anything on the [employer] PAP for the Soliqua .  Can you please check on both.  I told her you will be back in this week and can print out an application for her to come by this week.  Thanks

## 2024-02-09 NOTE — Telephone Encounter (Signed)
 Filled and faxed pt and provider portion PAP [employer] (Soliqua ) to [facility] Jon pt will come in to office to sign pap.and will faxed AZ&ME (Farxiga )

## 2024-02-11 ENCOUNTER — Telehealth: Payer: Self-pay

## 2024-02-11 NOTE — Telephone Encounter (Signed)
 Good morning! Faxed in provider portion to [employer]Me this morning. Soliqua  PAP patient portion is at front office of [facility] awaiting signature. Attempted to contact patient to notify but had to leave a voicemail.  Can you also place patient portion of soliqua  app in the mail to him? Just in case he receives that before they are able to get by the office.  Thank you! Jon VEAR Lindau, PharmD Clinical Pharmacist [phone]

## 2024-02-11 NOTE — Progress Notes (Signed)
° °  02/11/2024  Patient ID: [name], male   DOB: 02-18-1951, 73 y.o.   MRN: [id number]  Attempted to contact patient to notify that Soliqua  assistance applicaton is at the office awaiting completion by patient. Left HIPAA compliant message for patient to return my call at their convenience.   Jon VEAR Lindau, PharmD Clinical Pharmacist [phone]

## 2024-02-11 NOTE — Progress Notes (Signed)
° °  02/11/2024  Patient ID: [name], male   DOB: [birth year], 73 y.o.   MRN: [id number]  Submitted completed application for Soliqua  PAP renewal to [employer] for 2026, pending company decision.  Jon VEAR Lindau, PharmD Clinical Pharmacist [phone]

## 2024-02-15 ENCOUNTER — Other Ambulatory Visit (HOSPITAL_COMMUNITY): Payer: Self-pay

## 2024-02-16 NOTE — Telephone Encounter (Signed)
 Received approval letter from [employer] (Soliqua ) thru 02/23/2025,approval letter index left a HIPAA VM.

## 2024-02-22 NOTE — Telephone Encounter (Signed)
 Gave AZ&ME (Farxiga ) a call we still have not received approval letter from company, spoke with representative explain they still have not received pt portion of application pt can either call or we can faxed pt portion explain that I will be faxing pt portion again today,explain they do not need provider portion only pt portion.

## 2024-02-24 NOTE — Telephone Encounter (Signed)
 PAP Soliqua  received, left message for pt

## 2024-03-03 ENCOUNTER — Telehealth: Payer: Self-pay

## 2024-03-03 NOTE — Progress Notes (Signed)
" ° °  03/03/2024  Patient ID: [name], male   DOB: [birth year], 74 y.o.   MRN: [id number]  Contacted [employer] to follow up on patient's renewal for Soliqua  PAP. Application has been APPROVED through 02/23/25.  Contacted [employer]&Me to follow up on patient's renewal for Farxiga  PAP. Application has been APPROVED through 02/23/25.  Jon VEAR Lindau, PharmD Clinical Pharmacist [phone]  "

## 2024-03-29 ENCOUNTER — Telehealth: Payer: Self-pay | Admitting: Internal Medicine

## 2024-03-29 ENCOUNTER — Other Ambulatory Visit (HOSPITAL_COMMUNITY): Payer: Self-pay

## 2024-03-30 ENCOUNTER — Telehealth: Payer: Self-pay

## 2024-03-30 ENCOUNTER — Other Ambulatory Visit (HOSPITAL_COMMUNITY): Payer: Self-pay

## 2024-03-30 NOTE — Telephone Encounter (Signed)
 Returned call to Pt spouse, LVM for her to call back.  Copied from CRM #[id number]. Topic: General - Other >> Mar 30, 2024  3:38 PM Antwanette L wrote: Reason for CRM: Gailen pt wife is calling to speak w/ someone directly from the office. She didn't want to tell the reason for the call. Vanetta is requesting a callback at  [phone]

## 2024-03-30 NOTE — Telephone Encounter (Signed)
 Pharmacy Patient Advocate Encounter  Received notification from [employer] that Prior Authorization for [facility] PEN NEEDLES NANO 2 has been APPROVED from 02/29/2024 to 03/30/2025. Ran test claim, Copay is $40.00. This test claim was processed through [facility]- copay amounts may vary at other pharmacies due to pharmacy/plan contracts, or as the patient moves through the different stages of their insurance plan.   PA #/Case ID/Reference #: [id number]

## 2024-03-30 NOTE — Telephone Encounter (Signed)
 Pharmacy Patient Advocate Encounter   Received notification from Physician's Office that prior authorization for {Emb[employer] pen needles nano 2 is required/requested.   Insurance verification completed.   The patient is insured through [employer].   Per test claim: PA required; PA submitted to above mentioned insurance via Latent Key/confirmation #/EOC [id number] Status is pending

## 2024-03-31 ENCOUNTER — Telehealth: Payer: Self-pay | Admitting: Family Medicine

## 2024-03-31 MED ORDER — INSULIN PEN NEEDLE 32G X 4 MM MISC: 1 refills | Status: AC

## 2024-03-31 NOTE — Telephone Encounter (Signed)
 Pt 's wife wants to know if we have samples of  nano needles for her husbands insulin 

## 2024-03-31 NOTE — Telephone Encounter (Signed)
 We did not have samples, Called pt to let them know.

## 2024-03-31 NOTE — Telephone Encounter (Signed)
 I have sent in generic pen needles to local pharmacy as I am not sure what his insurance will cover

## 2024-06-01 ENCOUNTER — Ambulatory Visit: Payer: Self-pay | Admitting: Family Medicine

## 2024-07-29 ENCOUNTER — Ambulatory Visit: Payer: Self-pay | Admitting: Family Medicine
